# Patient Record
Sex: Male | Born: 1965 | Race: White | Hispanic: No | Smoking: Former smoker
Health system: Southern US, Community
[De-identification: ages and names within clinical notes are randomized; demographics above are authoritative.]

## PROBLEM LIST (undated history)

## (undated) DIAGNOSIS — T7840XA Allergy, unspecified, initial encounter: Secondary | ICD-10-CM

## (undated) DIAGNOSIS — B019 Varicella without complication: Secondary | ICD-10-CM

## (undated) DIAGNOSIS — G4730 Sleep apnea, unspecified: Secondary | ICD-10-CM

## (undated) DIAGNOSIS — E119 Type 2 diabetes mellitus without complications: Secondary | ICD-10-CM

## (undated) DIAGNOSIS — F1910 Other psychoactive substance abuse, uncomplicated: Secondary | ICD-10-CM

## (undated) DIAGNOSIS — N189 Chronic kidney disease, unspecified: Secondary | ICD-10-CM

## (undated) DIAGNOSIS — R51 Headache: Secondary | ICD-10-CM

## (undated) DIAGNOSIS — I10 Essential (primary) hypertension: Secondary | ICD-10-CM

## (undated) HISTORY — PX: MOUTH SURGERY: SHX715

## (undated) HISTORY — PX: COLONOSCOPY: SHX174

## (undated) HISTORY — DX: Other psychoactive substance abuse, uncomplicated: F19.10

## (undated) HISTORY — DX: Essential (primary) hypertension: I10

## (undated) HISTORY — DX: Varicella without complication: B01.9

## (undated) HISTORY — DX: Allergy, unspecified, initial encounter: T78.40XA

## (undated) HISTORY — DX: Type 2 diabetes mellitus without complications: E11.9

---

## 2002-01-21 ENCOUNTER — Ambulatory Visit (HOSPITAL_BASED_OUTPATIENT_CLINIC_OR_DEPARTMENT_OTHER): Admission: RE | Admit: 2002-01-21 | Discharge: 2002-01-21 | Payer: Self-pay | Admitting: Internal Medicine

## 2002-01-28 ENCOUNTER — Emergency Department (HOSPITAL_COMMUNITY): Admission: EM | Admit: 2002-01-28 | Discharge: 2002-01-28 | Payer: Self-pay | Admitting: Emergency Medicine

## 2002-07-15 ENCOUNTER — Emergency Department (HOSPITAL_COMMUNITY): Admission: EM | Admit: 2002-07-15 | Discharge: 2002-07-15 | Payer: Self-pay

## 2003-08-20 HISTORY — PX: UVULOPALATOPHARYNGOPLASTY: SHX827

## 2004-08-19 HISTORY — PX: TONSILLECTOMY: SHX5217

## 2004-10-12 ENCOUNTER — Inpatient Hospital Stay (HOSPITAL_COMMUNITY): Admission: RE | Admit: 2004-10-12 | Discharge: 2004-10-14 | Payer: Self-pay | Admitting: Otolaryngology

## 2011-08-22 ENCOUNTER — Ambulatory Visit (INDEPENDENT_AMBULATORY_CARE_PROVIDER_SITE_OTHER): Payer: BC Managed Care – PPO | Admitting: Family Medicine

## 2011-08-22 ENCOUNTER — Encounter: Payer: Self-pay | Admitting: Family Medicine

## 2011-08-22 DIAGNOSIS — R310 Gross hematuria: Secondary | ICD-10-CM

## 2011-08-22 DIAGNOSIS — R5383 Other fatigue: Secondary | ICD-10-CM

## 2011-08-22 DIAGNOSIS — R5381 Other malaise: Secondary | ICD-10-CM

## 2011-08-22 DIAGNOSIS — N529 Male erectile dysfunction, unspecified: Secondary | ICD-10-CM

## 2011-08-22 DIAGNOSIS — F17200 Nicotine dependence, unspecified, uncomplicated: Secondary | ICD-10-CM

## 2011-08-22 DIAGNOSIS — G4733 Obstructive sleep apnea (adult) (pediatric): Secondary | ICD-10-CM | POA: Insufficient documentation

## 2011-08-22 DIAGNOSIS — R1032 Left lower quadrant pain: Secondary | ICD-10-CM

## 2011-08-22 LAB — POCT URINALYSIS DIPSTICK
Blood, UA: NEGATIVE
Leukocytes, UA: NEGATIVE
Nitrite, UA: NEGATIVE
Protein, UA: NEGATIVE
Urobilinogen, UA: 1
pH, UA: 7

## 2011-08-22 LAB — CBC WITH DIFFERENTIAL/PLATELET
Basophils Absolute: 0.1 10*3/uL (ref 0.0–0.1)
Eosinophils Absolute: 0.5 10*3/uL (ref 0.0–0.7)
Lymphocytes Relative: 30.9 % (ref 12.0–46.0)
MCHC: 34 g/dL (ref 30.0–36.0)
Neutro Abs: 5.4 10*3/uL (ref 1.4–7.7)
Neutrophils Relative %: 57.5 % (ref 43.0–77.0)
Platelets: 387 10*3/uL (ref 150.0–400.0)
RDW: 12.6 % (ref 11.5–14.6)

## 2011-08-22 LAB — TSH: TSH: 0.48 u[IU]/mL (ref 0.35–5.50)

## 2011-08-22 LAB — BASIC METABOLIC PANEL
Chloride: 106 mEq/L (ref 96–112)
Creatinine, Ser: 0.9 mg/dL (ref 0.4–1.5)
Sodium: 140 mEq/L (ref 135–145)

## 2011-08-22 MED ORDER — VARENICLINE TARTRATE 0.5 MG X 11 & 1 MG X 42 PO MISC: ORAL | Status: AC

## 2011-08-22 MED ORDER — VARENICLINE TARTRATE 1 MG PO TABS: 1.0000 mg | ORAL_TABLET | Freq: Two times a day (BID) | ORAL | Status: AC

## 2011-08-22 MED ORDER — SILDENAFIL CITRATE 100 MG PO TABS: 50.0000 mg | ORAL_TABLET | Freq: Every day | ORAL | Status: DC | PRN

## 2011-08-22 NOTE — Progress Notes (Signed)
  Subjective:    Patient ID: Gary Mora, male    DOB: 1966/05/11, 46 y.o.   MRN: [id number]  HPI  New patient to establish care. Patient has history of obesity, obstructive sleep apnea and ongoing nicotine use. Past history of alcohol abuse but not for 2 years now. He relates recent problems with constipation past few weeks. This is a new symptom. Intermittent left lower quadrant pain with no clear triggering factors. Symptoms usually very brief. Pain is mild.  He also relates about 6 months of possible gross hematuria. Describes brownish discolored urine. Symptoms are very intermittent. No dysuria. No flank pain. Denies appetite or weight changes. No fever or chills.  No RUQ abdominal pain. No slow urine stream.  Ongoing nicotine use. Specifically requests trial of Chantix. Has tried nicotine replacement in past without success.  Past Medical History  Diagnosis Date  . Substance abuse   . Allergy   . Chicken pox    Past Surgical History  Procedure Date  . Tonsillectomy 2006  . Uvulopalatopharyngoplasty 2005    reports that he has been smoking Cigarettes.  He has a 30 pack-year smoking history. He does not have any smokeless tobacco history on file. His alcohol and drug histories not on file. family history includes Arthritis in his father and mother and Hypothyroidism in his sister. Allergies  Allergen Reactions  . Asa Buff (Mag (Buffered Aspirin)     hives      Review of Systems  Constitutional: Positive for fatigue. Negative for fever, chills, appetite change and unexpected weight change.  HENT: Negative for trouble swallowing.   Respiratory: Negative for cough and shortness of breath.   Cardiovascular: Negative for chest pain, palpitations and leg swelling.  Gastrointestinal: Positive for abdominal pain and constipation. Negative for nausea, vomiting and blood in stool.  Genitourinary: Positive for dysuria and hematuria. Negative for urgency, decreased urine volume and  discharge.  Skin: Negative for rash.  Neurological: Negative for dizziness, syncope and headaches.  Hematological: Negative for adenopathy.       Objective:   Physical Exam  Constitutional: He is oriented to person, place, and time. He appears well-developed and well-nourished.  HENT:  Right Ear: External ear normal.  Left Ear: External ear normal.  Mouth/Throat: Oropharynx is clear and moist.  Neck: Neck supple. No thyromegaly present.  Cardiovascular: Normal rate and regular rhythm.   Pulmonary/Chest: Effort normal and breath sounds normal. No respiratory distress. He has no wheezes. He has no rales.  Abdominal: Soft. Bowel sounds are normal. He exhibits no distension and no mass. There is no tenderness. There is no rebound and no guarding.  Musculoskeletal: He exhibits no edema.  Lymphadenopathy:    He has no cervical adenopathy.  Neurological: He is alert and oriented to person, place, and time.  Skin: No rash noted.  Psychiatric: He has a normal mood and affect.          Assessment & Plan:  #1 nicotine use. Discussed smoking cessation strategies. Trial of Chantix. Starter pack and maintenance pack written for  #2 intermittent left lower quadrant abdominal pain. Symptoms mild. No history of known diverticular disease. Check CBC and urine dipstick. #3 reported gross hematuria. He describes "brown" urine.  Recheck urine dipstick. Consider urology referral if hematuria confirmed. #4 constipation with family history of hypothyroidism. Check TSH. #5 erectile dysfunction. Trial of Viagra 50-100 mg daily as needed

## 2011-08-23 NOTE — Progress Notes (Signed)
Quick Note:  Wife informed, pt will pick up hemocult card ______

## 2011-09-19 ENCOUNTER — Ambulatory Visit
Admission: RE | Admit: 2011-09-19 | Discharge: 2011-09-19 | Disposition: A | Discharge status: Home / Self Care | Payer: BC Managed Care – PPO | Source: Ambulatory Visit | Attending: Family Medicine | Admitting: Family Medicine

## 2011-09-19 DIAGNOSIS — R1032 Left lower quadrant pain: Secondary | ICD-10-CM

## 2011-09-19 DIAGNOSIS — R310 Gross hematuria: Secondary | ICD-10-CM

## 2011-09-20 NOTE — Progress Notes (Signed)
Quick Note:  Pt father informed ______ 

## 2011-11-20 ENCOUNTER — Ambulatory Visit: Payer: Self-pay | Admitting: Family Medicine

## 2011-11-20 ENCOUNTER — Telehealth: Payer: Self-pay | Admitting: *Deleted

## 2011-11-20 DIAGNOSIS — Z0289 Encounter for other administrative examinations: Secondary | ICD-10-CM

## 2011-11-20 NOTE — Telephone Encounter (Signed)
Message left on home VM that he was a No Show for 3 month F/U today, requested a call back to explain and reschedule.

## 2012-08-20 ENCOUNTER — Other Ambulatory Visit (INDEPENDENT_AMBULATORY_CARE_PROVIDER_SITE_OTHER): Payer: BC Managed Care – PPO

## 2012-08-20 DIAGNOSIS — Z0000 Encounter for general adult medical examination without abnormal findings: Secondary | ICD-10-CM

## 2012-08-20 LAB — BASIC METABOLIC PANEL
CO2: 30 mEq/L (ref 19–32)
Calcium: 8.4 mg/dL (ref 8.4–10.5)
Creatinine, Ser: 0.8 mg/dL (ref 0.4–1.5)
GFR: 108.57 mL/min (ref >60.00)

## 2012-08-20 LAB — CBC WITH DIFFERENTIAL/PLATELET
Basophils Absolute: 0.1 10*3/uL (ref 0.0–0.1)
Basophils Relative: 0.5 % (ref 0.0–3.0)
Eosinophils Absolute: 0.5 10*3/uL (ref 0.0–0.7)
Lymphocytes Relative: 25.4 % (ref 12.0–46.0)
MCHC: 33.9 g/dL (ref 30.0–36.0)
Monocytes Relative: 4.6 % (ref 3.0–12.0)
Neutrophils Relative %: 65.2 % (ref 43.0–77.0)
RBC: 5.3 Mil/uL (ref 4.22–5.81)

## 2012-08-20 LAB — LIPID PANEL
Cholesterol: 158 mg/dL (ref 0–200)
HDL: 20.2 mg/dL — ABNORMAL LOW (ref >39.00)
Total CHOL/HDL Ratio: 8
Triglycerides: 336 mg/dL — ABNORMAL HIGH (ref 0.0–149.0)
VLDL: 67.2 mg/dL — ABNORMAL HIGH (ref 0.0–40.0)

## 2012-08-20 LAB — POCT URINALYSIS DIPSTICK
Glucose, UA: NEGATIVE
Ketones, UA: NEGATIVE
Spec Grav, UA: 1.025

## 2012-08-20 LAB — HEPATIC FUNCTION PANEL
Alkaline Phosphatase: 42 U/L (ref 39–117)
Bilirubin, Direct: 0 mg/dL (ref 0.0–0.3)
Total Protein: 7 g/dL (ref 6.0–8.3)

## 2012-08-24 ENCOUNTER — Encounter: Payer: Self-pay | Admitting: Family Medicine

## 2012-08-24 ENCOUNTER — Ambulatory Visit (INDEPENDENT_AMBULATORY_CARE_PROVIDER_SITE_OTHER): Payer: BC Managed Care – PPO | Admitting: Family Medicine

## 2012-08-24 VITALS — BP 136/84 | HR 72 | Temp 98.5°F | Resp 12 | Ht 71.0 in | Wt 262.0 lb

## 2012-08-24 DIAGNOSIS — R319 Hematuria, unspecified: Secondary | ICD-10-CM

## 2012-08-24 DIAGNOSIS — Z0000 Encounter for general adult medical examination without abnormal findings: Secondary | ICD-10-CM

## 2012-08-24 DIAGNOSIS — E8881 Metabolic syndrome: Secondary | ICD-10-CM

## 2012-08-24 LAB — POCT URINALYSIS DIPSTICK
Protein, UA: NEGATIVE
Spec Grav, UA: 1.015
Urobilinogen, UA: 0.2

## 2012-08-24 MED ORDER — SILDENAFIL CITRATE 100 MG PO TABS: 100.0000 mg | ORAL_TABLET | ORAL | Status: DC | PRN

## 2012-08-24 MED ORDER — TETANUS-DIPHTH-ACELL PERTUSSIS 5-2.5-18.5 LF-MCG/0.5 IM SUSP: 0.5000 mL | Freq: Once | INTRAMUSCULAR | Status: DC

## 2012-08-24 NOTE — Progress Notes (Signed)
Subjective:    Patient ID: Gary Mora, male    DOB: 1966-05-11, 47 y.o.   MRN: [id number]  HPI Patient here for complete physical. He has history of obesity and obstructive sleep apnea. He has done well following uvulopalatophryngioplasty and no longer uses CPAP. He does still smoke and is trying to quit on his own. He is also recently started back exercising. Last tetanus was 10 years ago. Already had flu vaccine. Drinks lots of sodas. Excessive calorie intake.  History of recurrent boils right thigh. Went to urgent care twice. Initially placed on sulfa and improved. Subsequently had recurrence several months ago and went back for incision and drainage and placed on clindamycin. To his knowledge, never cultured. Then had recurrent boil few days ago. This ruptured yesterday with drainage of pus. Much improved today. No fevers. No chills.  Patient had reported hematuria several months ago. Was seen here in urine dipstick negative. No recent gross hematuria.  Past Medical History  Diagnosis Date  . Allergy   . Chicken pox   . Substance abuse    Past Surgical History  Procedure Date  . Tonsillectomy 2006  . Uvulopalatopharyngoplasty 2005    reports that he has been smoking Cigarettes.  He has a 30 pack-year smoking history. He does not have any smokeless tobacco history on file. His alcohol and drug histories not on file. family history includes Arthritis in his father and mother and Hypothyroidism in his sister. Allergies  Allergen Reactions  . Asa Buff (Mag (Buffered Aspirin)     hives      Review of Systems  Constitutional: Negative for fever, activity change, appetite change and fatigue.  HENT: Negative for ear pain, congestion and trouble swallowing.   Eyes: Negative for pain and visual disturbance.  Respiratory: Negative for cough, shortness of breath and wheezing.   Cardiovascular: Negative for chest pain and palpitations.  Gastrointestinal: Negative for nausea, vomiting,  abdominal pain, diarrhea, constipation, blood in stool, abdominal distention and rectal pain.  Genitourinary: Negative for dysuria, hematuria and testicular pain.  Musculoskeletal: Negative for joint swelling and arthralgias.  Skin: Negative for rash.  Neurological: Negative for dizziness, syncope and headaches.  Hematological: Negative for adenopathy.  Psychiatric/Behavioral: Negative for confusion and dysphoric mood.       Objective:   Physical Exam  Constitutional: He is oriented to person, place, and time. He appears well-developed and well-nourished. No distress.  HENT:  Head: Normocephalic and atraumatic.  Right Ear: External ear normal.  Left Ear: External ear normal.  Mouth/Throat: Oropharynx is clear and moist.  Eyes: Conjunctivae normal and EOM are normal. Pupils are equal, round, and reactive to light.  Neck: Normal range of motion. Neck supple. No thyromegaly present.  Cardiovascular: Normal rate, regular rhythm and normal heart sounds.   No murmur heard. Pulmonary/Chest: No respiratory distress. He has no wheezes. He has no rales.  Abdominal: Soft. Bowel sounds are normal. He exhibits no distension and no mass. There is no tenderness. There is no rebound and no guarding.  Musculoskeletal: He exhibits no edema.  Lymphadenopathy:    He has no cervical adenopathy.  Neurological: He is alert and oriented to person, place, and time. He displays normal reflexes. No cranial nerve deficit.  Skin: No rash noted.       Right upper inner groin reveals very small wound about 2 mm diameter with no fluctuance. No pustules. Nontender. No cellulitis changes.  Psychiatric: He has a normal mood and affect.  Assessment & Plan:  #1 health maintenance. Patient has metabolic syndrome based on high triglycerides, low HDL, waist circumference 42 inches, and borderline elevated blood pressure. Thus far no hyperglycemia. Work on weight loss. Reduce soda intake. Needs to quit smoking.   #2 recurrent goals right thigh. Apparently had abscess which drained spontaneously yesterday and is much improved today. For any future occurrences get in here for incision and drainage and culture #3 trace blood on urine dipstick. Repeat urinalysis and if blood persists urine micro-

## 2012-08-24 NOTE — Patient Instructions (Addendum)
Metabolic Syndrome, Adult Metabolic syndrome descibes a group of risk factors for heart disease and diabetes. This syndrome has other names including Insulin Resistance Syndrome. The more risk factors you have, the higher your risk of having a heart attack, stroke, or developing diabetes. These risk factors include:  High blood sugar.  High blood triglyceride (a fat found in the blood) level.  High blood pressure.  Abdominal obesity (your extra weight is around your waist instead of your hips).  Low levels of high-density lipoprotein, HDL (good blood cholesterol). If you have any three of these risk factors, you have metabolic syndrome. If you have even one of these factors, you should make lifestyle changes to improve your health in order to prevent serious health diseases.  In people with metabolic syndrome, the cells do not respond properly to insulin. This can lead to high levels of glucose in the blood, which can interfere with normal body processes. Eventually, this can cause high blood pressure and higher fat levels in the blood, and inflammation of your blood vessels. The result can be heart disease and stroke.  CAUSES   Eating a diet rich in calories and saturated fat.  Too little physical activity.  Being overweight. Other underlying causes are:  Family history (genetics).  Ethnicity (South Asians are at a higher risk).  Older age (your chances of developing metabolic syndrome are higher as you grow older).  Insulin resistance. SYMPTOMS  By itself, metabolic syndrome has no symptoms. However, you might have symptoms of diabetes (high blood sugar) or high blood pressure, such as:  Increased thirst, urination, and tiredness.  Dizzy spells.  Dull headaches that are unusual for you.  Blurred vision.  Nosebleeds. DIAGNOSIS  Your caregiver may make a diagnosis of metabolic syndrome if you have at least three of these factors:  If you are overweight mostly around the  waist. This means a waistline greater than 40" in men and more than 35" in women. The waistline limits are 31 to 35 inches for women and 37 to 39 inches for men. In those who have certain genetic risk factors, such as having a family history of diabetes or being of Asian descent.  If you have a blood pressure of 130/85 mm Hg or more, or if you are being treated for high blood pressure.  If your blood triglyceride level is 150 mg/dL or more, or you are being treated for high levels of triglyceride.  If the level of HDL in your blood is below 40 mg/dL in men, less than 50 mg/dL in women, or you are receiving treatment for low levels of HDL.  If the level of sugar in your blood is high with fasting blood sugar level of 110 mg/dL or more, or you are under treatment for diabetes. TREATMENT  Your caregiver may have you make lifestyle changes, which may include:  Exercise.  Losing weight.  Maintaining a healthy diet.  Quitting smoking. The lifestyle changes listed above are key in reducing your risk for heart disease and stroke. Medicines may also be prescribed to help your body respond to insulin better and to reduce your blood pressure and blood fat levels. Aspirin may be recommended to reduce risks of heart disease or stroke.  HOME CARE INSTRUCTIONS   Exercise.  Measure your waist at regular intervals just above the hipbones after you have breathed out.  Maintain a healthy diet.  Eat fruits, such as apples, oranges, and pears.  Eat vegetables.  Eat legumes, such as kidney   beans, peas, and lentils.  Eat food rich in soluble fiber, such as whole grain cereal, oatmeal, and oat bran.  Use olive or safflower oils and avoid saturated fats.  Eat nuts.  Limit the amount of salt you eat or add to food.  Limit the amount of alcohol you drink.  Include fish in your diet, if possible.  Stop smoking if you are a smoker.  Maintain regular follow-up appointments.  Follow your  caregiver's advice. SEEK MEDICAL CARE IF:   You feel very tired or fatigued.  You develop excessive thirst.  You pass large quantities of urine.  You are putting on weight around your waist rather than losing weight.  You develop headaches over and over again.  You have off-and-on dizzy spells. SEEK IMMEDIATE MEDICAL CARE IF:   You develop nosebleeds.  You develop sudden blurred vision.  You develop sudden dizzy spells.  You develop chest pains, trouble breathing, or feel an abnormal or irregular heart beat.  You have a fainting episode.  You develop any sudden trouble speaking and/or swallowing.  You develop sudden weakness in one arm and/or one leg. MAKE SURE YOU:   Understand these instructions.  Will watch your condition.  Will get help right away if you are not doing well or get worse. Document Released: 11/12/2007 Document Revised: 10/28/2011 Document Reviewed: 11/12/2007 [facility] Patient Information 2013 [place], [place].

## 2012-08-28 ENCOUNTER — Other Ambulatory Visit: Payer: Self-pay | Admitting: Family Medicine

## 2012-09-04 ENCOUNTER — Encounter: Payer: Self-pay | Admitting: Family Medicine

## 2012-11-05 ENCOUNTER — Other Ambulatory Visit: Payer: Self-pay | Admitting: Urology

## 2012-11-30 ENCOUNTER — Encounter (HOSPITAL_COMMUNITY): Payer: Self-pay | Admitting: *Deleted

## 2012-11-30 NOTE — Pre-Procedure Instructions (Signed)
Asked to bring blue folder the day of the procedure,insurance card,I.D. driver's license,wear comfortable clothing and have a driver for the day. Asked not to take Advil,Motrin,Ibuprofen,Aleve or any NSAIDS, Aspirin, Aspirin products or Toradol for 72 hours prior to procedure,  No vitamins or herbal medications 7 days prior to procedure. Instructed to take laxative per doctor's office instructions and eat a light dinner the evening before procedure.   To arrive at 0530  for lithotripsy procedure.

## 2012-12-04 ENCOUNTER — Encounter (HOSPITAL_COMMUNITY): Payer: Self-pay | Admitting: Pharmacy Technician

## 2012-12-18 NOTE — H&P (Signed)
Reason For Visit  Gary Mora returns for followup today. He is here to discuss his recent CT findings. His history is summarized below. At his last visit the patient had a urine test for NMP 22 which was negative. Patient also has subsequently had a hematuria protocol CT which showed a relatively large 13 mm stone in the lower pole of his left kidney. This was not causing significant obstruction.   History of Present Illness         Gary Mora presented recently as a consultation request of Dr. Caryl Never for further assessment of some nonspecific left-sided back/flank discomfort and previous episode of gross hematuria. Gary Mora is currently 47 years of age. In the past we saw him for elective vasectomy and subsequently for some mild testicular discomfort. The patient had a renal ultrasound performed approximately a year ago after he complained of some nonspecific left flank pain and had multiple episodes of gross hematuria. More recently he has been noted to have persistent microhematuria. BUN and creatinine were recently normal at 11 and 0.8 respectively. The patient does have a tobacco use history of approximately 30 pack years. No pain or gross hematuria x 1 year. Positive family h/o kidney stones. Has been using 50-100mg  prn and this works well. No voiding complaints. No history of testosterone or PSA testing.   Past Medical History Problems  1. History of  Adult Sleep Apnea 780.57 2. Former Smoker V15.82  Surgical History Problems  1. History of  Oral Surgery Tooth Extraction 2. History of  Throat Surgery  Current Meds 1. Aleve CAPS; Therapy: (Recorded:30Jan2014) to 2. Chantix TABS; Therapy: (Recorded:30Jan2014) to 3. PredniSONE 50 MG Oral Tablet; (1) TAB PO STARTING 13H PRIOR THEN 7H PRIOR AND AGAIN  AT 1H PRIOR TO CT; Therapy: 03Feb2014 to (Last Rx:03Feb2014)  Requested for: 03Feb2014 4. Viagra 100 MG Oral Tablet; Therapy: 06Jan2014 to  Allergies Medication  1. Aspirin TABS Non-Medication   2. Shellfish  Family History Problems  1. Family history of  Family Health Status - Father's Age 403. Family history of  Family Health Status - Mother's Age 40. Family history of  Family Health Status Number Of Children 1 son, 1 daughter 4. Maternal history of  Nephrolithiasis  Social History Problems  1. Caffeine Use 3/day 2. Marital History - Divorced V61.03 Denied  3. History of  Alcohol Use  Review of Systems Genitourinary, constitutional, skin, eye, otolaryngeal, hematologic/lymphatic, cardiovascular, pulmonary, endocrine, musculoskeletal, gastrointestinal, neurological and psychiatric system(s) were reviewed and pertinent findings if present are noted.  Genitourinary: nocturia and erectile dysfunction, but no hematuria and preserved libido.  Gastrointestinal: flank pain and abdominal pain.    Vitals Vital Signs [Data Includes: Last 1 Day]  13Mar2014 09:56AM  Blood Pressure: 165 / 104 Temperature: 97.9 F Heart Rate: 109  Well-developed well-nourished male in no acute distress Respiratory: Normal effort Cardiac: Regular rate and rhythm Abdomen: Soft and nontender no palpable masses no CVA tenderness Extremities: No tenderness no edema Neurologic: Nonfocal grossly  Results/Data Urine [Data Includes: Last 1 Day]   13Mar2014 COLOR YELLOW  APPEARANCE CLEAR  SPECIFIC GRAVITY 1.015  pH 6.5  GLUCOSE NEG mg/dL BILIRUBIN NEG  KETONE NEG mg/dL BLOOD SMALL  PROTEIN NEG mg/dL UROBILINOGEN 1 mg/dL NITRITE NEG  LEUKOCYTE ESTERASE NEG  SQUAMOUS EPITHELIAL/HPF NONE SEEN  WBC NONE SEEN WBC/hpf RBC 0-2 RBC/hpf BACTERIA NONE SEEN  CRYSTALS NONE SEEN  CASTS NONE SEEN  Other AMORPHOUS PRESENT  Selected Results  UA With REFLEX 13Mar2014 09:45AM Barron Al[facility]  Test Name Result  Flag Reference COLOR YELLOW  YELLOW APPEARANCE CLEAR  CLEAR SPECIFIC GRAVITY 1.015  1.005-1.030 pH 6.5  5.0-8.0 GLUCOSE NEG mg/dL  NEG BILIRUBIN NEG  NEG KETONE NEG  mg/dL  NEG BLOOD SMALL A NEG PROTEIN NEG mg/dL  NEG UROBILINOGEN 1 mg/dL  2.9-5.2 NITRITE NEG  NEG LEUKOCYTE ESTERASE NEG  NEG SQUAMOUS EPITHELIAL/HPF NONE SEEN  RARE WBC NONE SEEN WBC/hpf  <3 RBC 0-2 RBC/hpf  <3 BACTERIA NONE SEEN  RARE CRYSTALS NONE SEEN  NONE SEEN CASTS NONE SEEN  NONE SEEN Other AMORPHOUS PRESENT    AU CT-HEMATURIA PROTOCOL 10Mar2014 12:00AM Barron Al[facility]  Test Name Result Flag Reference ** RADIOLOGY REPORT BY [facility] **   *RADIOLOGY REPORT*  Clinical Data: Gross hematuria.  CT ABDOMEN AND PELVIS WITHOUT AND WITH CONTRAST  Technique: Multidetector CT imaging of the abdomen and pelvis was performed without contrast material in one or both body regions, followed by contrast material(s) and further sections in one or both body regions.  Contrast: 125 ml of Isovue 300.  Comparison: No priors.  Findings:  Lung Bases: Unremarkable.  Abdomen/Pelvis: 13 mm calculus in the lower pole collecting system of the left kidney. No additional calcifications are noted within the contralateral collecting system, along the course of either ureter, or within the lumen of the urinary bladder. No hydroureteronephrosis or perinephric stranding to suggest urinary tract obstruction at this time. Post contrast images of the right kidney are unremarkable. Multiple low attenuation lesions in the left kidney are noted, the largest of which are compatible with simple cysts, with the largest lesion measuring 3.2 cm in the lower pole of the left kidney. Several of the other lesions are too small to definitively characterize, but are also favored to represent small cysts. Post contrast delayed images demonstrate no definite filling defect within the collecting system of either kidney, along the course of either ureter, or within the lumen of the urinary bladder to strongly suggest the presence of a urothelial neoplasm at this time.  The appearance of the  liver, gallbladder, pancreas, spleen and bilateral adrenal glands is unremarkable. There is a retroaortic left renal vein (normal anatomical variant) incidentally noted. Atherosclerosis throughout the abdominal and pelvic vasculature, without definite aneurysm or dissection. There are a few colonic diverticula, most pronounced in the region of the sigmoid colon, without surrounding inflammatory changes to suggest acute colonic diverticulitis at this time. No significant volume of ascites. No pneumoperitoneum. No pathologic distension of small bowel. Prostate gland is unremarkable in appearance.  Musculoskeletal: There are no aggressive appearing lytic or blastic lesions noted in the visualized portions of the skeleton.  IMPRESSION: 1. A 13 mm nonobstructive calculus in the lower pole collecting system of the left kidney. No ureteral stones or findings of urinary tract obstruction at this time. 2. Multiple low attenuation lesions in the left kidney. The largest of these are compatible with simple cysts, while the smaller lesions are too small to definitively characterize (but also likely represent small cysts). 3. Retroaortic left renal vein (normal anatomical variant) incidentally noted. 4. Atherosclerosis.   Original Report Authenticated By: Trudie Reed, M.D.  [id number] 30Jan2014 11:06AM [facility] SPECIMEN TYPE: URINE  Test Name Result Flag Reference NMP22 Negative  Negative  Assessment Assessed  1. Nephrolithiasis 592.0 2. 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 Gross Hematuria 599.71  Plan Health Maintenance (V70.0)  1. UA With REFLEX  Done: 13Mar2014 09:45AM  Discussion/Summary   Gary Mora has a 13 mm nonobstructing stone in the lower  pole of his left kidney. Presumptively, this is why he has had the hematuria. I do think that there is indication to treat the stone. His stone is quite faint on scout imaging. Hounsfield units are just over 400. This  is likely to be a fairly soft stone that should be amenable to successful ESWL. I think given the Hounsfield units and the size, that that would be a reasonable approach. Obviously, there may be some difficulty getting all of the pieces out of the lower pole, but we will certainly encourage him to do some things to try to improve clearance of the fragments. We discussed the procedure. I think we can forego cystoscopy for the time being in light of the diagnosis of the stone and the negative urine cytology. We will set this procedure up for sometime in the next several weeks on a nonurgent basis.

## 2012-12-21 ENCOUNTER — Encounter (HOSPITAL_COMMUNITY)
Admission: RE | Disposition: A | Discharge status: Home / Self Care | Payer: Self-pay | Source: Ambulatory Visit | Attending: Urology

## 2012-12-21 ENCOUNTER — Ambulatory Visit (HOSPITAL_COMMUNITY): Payer: BC Managed Care – PPO

## 2012-12-21 ENCOUNTER — Encounter (HOSPITAL_COMMUNITY): Payer: Self-pay | Admitting: General Practice

## 2012-12-21 ENCOUNTER — Ambulatory Visit (HOSPITAL_COMMUNITY)
Admission: RE | Admit: 2012-12-21 | Discharge: 2012-12-21 | Disposition: A | Discharge status: Home / Self Care | Payer: BC Managed Care – PPO | Source: Ambulatory Visit | Attending: Urology | Admitting: Urology

## 2012-12-21 DIAGNOSIS — Z79899 Other long term (current) drug therapy: Secondary | ICD-10-CM | POA: Insufficient documentation

## 2012-12-21 DIAGNOSIS — G4730 Sleep apnea, unspecified: Secondary | ICD-10-CM | POA: Insufficient documentation

## 2012-12-21 DIAGNOSIS — N200 Calculus of kidney: Secondary | ICD-10-CM

## 2012-12-21 HISTORY — DX: Headache: R51

## 2012-12-21 HISTORY — DX: Sleep apnea, unspecified: G47.30

## 2012-12-21 HISTORY — DX: Chronic kidney disease, unspecified: N18.9

## 2012-12-21 SURGERY — LITHOTRIPSY, ESWL
Anesthesia: LOCAL | Laterality: Left

## 2012-12-21 MED ORDER — HYDROCODONE-ACETAMINOPHEN 5-325 MG PO TABS: 1.0000 | ORAL_TABLET | Freq: Four times a day (QID) | ORAL | Status: DC | PRN

## 2012-12-21 MED ORDER — DIAZEPAM 5 MG PO TABS
10.0000 mg | ORAL_TABLET | ORAL | Status: AC
  Administered 2012-12-21: 10 mg via ORAL
  Filled 2012-12-21: qty 2

## 2012-12-21 MED ORDER — DEXTROSE-NACL 5-0.45 % IV SOLN
INTRAVENOUS | Status: DC
  Administered 2012-12-21: 07:00:00 via INTRAVENOUS

## 2012-12-21 MED ORDER — CIPROFLOXACIN HCL 500 MG PO TABS
500.0000 mg | ORAL_TABLET | ORAL | Status: AC
  Administered 2012-12-21: 500 mg via ORAL
  Filled 2012-12-21: qty 1

## 2012-12-21 MED ORDER — DIPHENHYDRAMINE HCL 25 MG PO CAPS
25.0000 mg | ORAL_CAPSULE | ORAL | Status: AC
  Administered 2012-12-21: 25 mg via ORAL
  Filled 2012-12-21: qty 1

## 2012-12-21 NOTE — Op Note (Signed)
See [facility] OP note scanned into chart. 

## 2012-12-21 NOTE — Interval H&P Note (Signed)
History and Physical Interval Note:  12/21/2012 8:06 AM  Gary Mora  has presented today for surgery, with the diagnosis of Renal Calculus  The various methods of treatment have been discussed with the patient and family. After consideration of risks, benefits and other options for treatment, the patient has consented to  Procedure(s): EXTRACORPOREAL SHOCK WAVE LITHOTRIPSY (ESWL) (Left) as a surgical intervention .  The patient's history has been reviewed, patient examined, no change in status, stable for surgery.  I have reviewed the patient's chart and labs.  Questions were answered to the patient's satisfaction.     Richy Spradley S

## 2014-02-01 ENCOUNTER — Ambulatory Visit (INDEPENDENT_AMBULATORY_CARE_PROVIDER_SITE_OTHER): Payer: BC Managed Care – PPO | Admitting: Family Medicine

## 2014-02-01 ENCOUNTER — Encounter: Payer: Self-pay | Admitting: Family Medicine

## 2014-02-01 VITALS — BP 130/82 | HR 120 | Temp 98.6°F | Ht 71.0 in | Wt 279.0 lb

## 2014-02-01 DIAGNOSIS — R059 Cough, unspecified: Secondary | ICD-10-CM

## 2014-02-01 DIAGNOSIS — J329 Chronic sinusitis, unspecified: Secondary | ICD-10-CM

## 2014-02-01 DIAGNOSIS — R05 Cough: Secondary | ICD-10-CM

## 2014-02-01 DIAGNOSIS — J310 Chronic rhinitis: Secondary | ICD-10-CM

## 2014-02-01 DIAGNOSIS — J309 Allergic rhinitis, unspecified: Secondary | ICD-10-CM

## 2014-02-01 MED ORDER — HYDROCOD POLST-CHLORPHEN POLST 10-8 MG/5ML PO LQCR: ORAL | Status: DC

## 2014-02-01 NOTE — Patient Instructions (Signed)
-  claritin and nasocort daily for 1 month  -Afrin for 4 days per instructions then stop  -As we discussed, we have prescribed a new medication (cough syrup) for you at this appointment. We discussed the common and serious potential adverse effects of this medication and you can review these and more with the pharmacist when you pick up your medication.  Please follow the instructions for use carefully and notify us immediately if you have any problems taking this medication.  -follow up if trouble breathing, fevers, facial pain, worsening instead of improving in 5-7 days or other concerns

## 2014-02-01 NOTE — Progress Notes (Signed)
No chief complaint on file.   HPI:  Acute visit for:  Cough: -started: yesterday -symptoms:nasal congestion, sore throat, cough -denies:fever, SOB, NVD, tooth pain -has tried: claritin -sick contacts/travel/risks: denies flu exposure, tick exposure or or Ebola risks -Hx of: allergies, pneumonia  ROS: See pertinent positives and negatives per HPI.  Past Medical History  Diagnosis Date  . Allergy   . Chicken pox   . Substance abuse   . Chronic kidney disease     kidney stone  . Headache(784.0)     tension after using the computer  . Sleep apnea     Had surgery to correct no cpapa used    Past Surgical History  Procedure Laterality Date  . Tonsillectomy  2006  . Uvulopalatopharyngoplasty  2005    Family History  Problem Relation Age of Onset  . Arthritis Mother   . Arthritis Father   . Hypothyroidism Sister     History   Social History  . Marital Status: Divorced    Spouse Name: N/A    Number of Children: N/A  . Years of Education: N/A   Social History Main Topics  . Smoking status: Former Smoker -- 1.00 packs/day for 30 years    Types: Cigarettes  . Smokeless tobacco: Never Used  . Alcohol Use: No  . Drug Use: No  . Sexual Activity: None   Other Topics Concern  . None   Social History Narrative  . None    Current outpatient prescriptions:Ascorbic Acid (VITAMIN C) 100 MG tablet, Take 100 mg by mouth daily.  , Disp: , Rfl: ;  loratadine (CLARITIN) 10 MG tablet, Take 10 mg by mouth daily., Disp: , Rfl: ;  multivitamin-iron-minerals-folic acid (CENTRUM) chewable tablet, Chew 1 tablet by mouth daily.  , Disp: , Rfl: ;  naproxen sodium (ANAPROX) 220 MG tablet, Take 220 mg by mouth 2 (two) times daily with a meal., Disp: , Rfl:  sildenafil (VIAGRA) 100 MG tablet, Take 1 tablet (100 mg total) by mouth as needed for erectile dysfunction., Disp: 6 tablet, Rfl: 11;  chlorpheniramine-HYDROcodone (TUSSIONEX PENNKINETIC ER) 10-8 MG/5ML LQCR, 51mL prn qhs for cough -  do not drive or operate machinery, Disp: 115 mL, Rfl: 0  EXAM:  Filed Vitals:   02/01/14 1528  BP: 130/82  Pulse: 120  Temp: 98.6 F (37 C)    Body mass index is 38.93 kg/(m^2).  GENERAL: vitals reviewed and listed above, alert, oriented, appears well hydrated and in no acute distress  HEENT: atraumatic, conjunttiva clear, no obvious abnormalities on inspection of external nose and ears, normal appearance of ear canals and TMs, clear nasal congestion, mild post oropharyngeal erythema with PND, no tonsillar edema or exudate, no sinus TTP  NECK: no obvious masses on inspection  LUNGS: clear to auscultation bilaterally, no wheezes, rales or rhonchi, good air movement  CV: HRRR, no peripheral edema  MS: moves all extremities without noticeable abnormality  PSYCH: pleasant and cooperative, no obvious depression or anxiety  ASSESSMENT AND PLAN:  Discussed the following assessment and plan:  Rhinosinusitis  Allergic rhinitis  Cough - Plan: chlorpheniramine-HYDROcodone (TUSSIONEX PENNKINETIC ER) 10-8 MG/5ML LQCR  -given HPI and exam findings today, a serious infection or illness is unlikely. We discussed potential etiologies, with  Allergic or VURI being most likely, and advised supportive care and monitoring and treatment of allergies. We discussed treatment side effects, likely course, antibiotic misuse, transmission, and signs of developing a serious illness. -of course, we advised to return or notify a  doctor immediately if symptoms worsen or persist or new concerns arise.    Patient Instructions  -claritin and nasocort daily for 1 month  -Afrin for 4 days per instructions then stop  -As we discussed, we have prescribed a new medication (cough syrup) for you at this appointment. We discussed the common and serious potential adverse effects of this medication and you can review these and more with the pharmacist when you pick up your medication.  Please follow the  instructions for use carefully and notify us immediately if you have any problems taking this medication.  -follow up if trouble breathing, fevers, facial pain, worsening instead of improving in 5-7 days or other concerns      KIM, HANNAH R.

## 2014-02-01 NOTE — Progress Notes (Signed)
Pre visit review using our clinic review tool, if applicable. No additional management support is needed unless otherwise documented below in the visit note. 

## 2014-03-03 ENCOUNTER — Other Ambulatory Visit (INDEPENDENT_AMBULATORY_CARE_PROVIDER_SITE_OTHER): Payer: BC Managed Care – PPO

## 2014-03-03 DIAGNOSIS — Z0000 Encounter for general adult medical examination without abnormal findings: Secondary | ICD-10-CM

## 2014-03-03 LAB — CBC WITH DIFFERENTIAL/PLATELET
BASOS PCT: 0.5 % (ref 0.0–3.0)
Basophils Absolute: 0.1 10*3/uL (ref 0.0–0.1)
EOS PCT: 4.9 % (ref 0.0–5.0)
Eosinophils Absolute: 0.5 10*3/uL (ref 0.0–0.7)
HCT: 48.7 % (ref 39.0–52.0)
HEMOGLOBIN: 16.4 g/dL (ref 13.0–17.0)
LYMPHS PCT: 30.3 % (ref 12.0–46.0)
Lymphs Abs: 3.1 10*3/uL (ref 0.7–4.0)
MCHC: 33.7 g/dL (ref 30.0–36.0)
MCV: 91.3 fl (ref 78.0–100.0)
MONO ABS: 0.6 10*3/uL (ref 0.1–1.0)
Monocytes Relative: 5.9 % (ref 3.0–12.0)
NEUTROS ABS: 6 10*3/uL (ref 1.4–7.7)
NEUTROS PCT: 58.4 % (ref 43.0–77.0)
Platelets: 425 10*3/uL — ABNORMAL HIGH (ref 150.0–400.0)
RBC: 5.33 Mil/uL (ref 4.22–5.81)
RDW: 13.5 % (ref 11.5–15.5)
WBC: 10.2 10*3/uL (ref 4.0–10.5)

## 2014-03-03 LAB — BASIC METABOLIC PANEL
BUN: 15 mg/dL (ref 6–23)
CHLORIDE: 98 meq/L (ref 96–112)
CO2: 26 meq/L (ref 19–32)
CREATININE: 0.7 mg/dL (ref 0.4–1.5)
Calcium: 9.1 mg/dL (ref 8.4–10.5)
GFR: 131.99 mL/min (ref >60.00)
Glucose, Bld: 95 mg/dL (ref 70–99)
Potassium: 4 mEq/L (ref 3.5–5.1)
SODIUM: 135 meq/L (ref 135–145)

## 2014-03-03 LAB — HEPATIC FUNCTION PANEL
ALBUMIN: 3.9 g/dL (ref 3.5–5.2)
ALK PHOS: 50 U/L (ref 39–117)
ALT: 27 U/L (ref 0–53)
AST: 29 U/L (ref 0–37)
BILIRUBIN DIRECT: 0.1 mg/dL (ref 0.0–0.3)
TOTAL PROTEIN: 7.6 g/dL (ref 6.0–8.3)
Total Bilirubin: 0.7 mg/dL (ref 0.2–1.2)

## 2014-03-03 LAB — POCT URINALYSIS DIPSTICK
Bilirubin, UA: NEGATIVE
Blood, UA: NEGATIVE
Glucose, UA: NEGATIVE
KETONES UA: NEGATIVE
Leukocytes, UA: NEGATIVE
Nitrite, UA: NEGATIVE
PH UA: 5.5
PROTEIN UA: NEGATIVE
UROBILINOGEN UA: 0.2

## 2014-03-03 LAB — LIPID PANEL
CHOL/HDL RATIO: 8
Cholesterol: 169 mg/dL (ref 0–200)
HDL: 22.1 mg/dL — ABNORMAL LOW (ref >39.00)
LDL CALC: 82 mg/dL (ref 0–99)
NONHDL: 146.9
Triglycerides: 326 mg/dL — ABNORMAL HIGH (ref 0.0–149.0)
VLDL: 65.2 mg/dL — AB (ref 0.0–40.0)

## 2014-03-03 LAB — TSH: TSH: 0.74 u[IU]/mL (ref 0.35–4.50)

## 2014-03-11 ENCOUNTER — Ambulatory Visit (INDEPENDENT_AMBULATORY_CARE_PROVIDER_SITE_OTHER): Payer: BC Managed Care – PPO | Admitting: Family Medicine

## 2014-03-11 ENCOUNTER — Encounter: Payer: Self-pay | Admitting: Family Medicine

## 2014-03-11 VITALS — BP 140/80 | HR 106 | Temp 98.1°F | Ht 71.0 in | Wt 275.0 lb

## 2014-03-11 DIAGNOSIS — E669 Obesity, unspecified: Secondary | ICD-10-CM | POA: Insufficient documentation

## 2014-03-11 DIAGNOSIS — Z0000 Encounter for general adult medical examination without abnormal findings: Secondary | ICD-10-CM

## 2014-03-11 NOTE — Progress Notes (Signed)
Pre visit review using our clinic review tool, if applicable. No additional management support is needed unless otherwise documented below in the visit note. 

## 2014-03-11 NOTE — Progress Notes (Signed)
Subjective:    Patient ID: Gary Mora, male    DOB: 05-09-1966, 48 y.o.   MRN: [id number]  HPI Patient seen for complete physical. He has history of obesity, obstructive sleep apnea, metabolic syndrome. He has been poorly compliant with diet and has gained about 13 pounds since last year. He drinks about 4 sodas per day. No consistent exercise. Still smokes. Tried Chantix last year which did not seem to help. He has given up alcohol together. He is considering Weight Watchers.  Past Medical History  Diagnosis Date  . Allergy   . Chicken pox   . Substance abuse   . Chronic kidney disease     kidney stone  . Headache(784.0)     tension after using the computer  . Sleep apnea     Had surgery to correct no cpapa used   Past Surgical History  Procedure Laterality Date  . Tonsillectomy  2006  . Uvulopalatopharyngoplasty  2005    reports that he has quit smoking. His smoking use included Cigarettes. He has a 30 pack-year smoking history. He has never used smokeless tobacco. He reports that he does not drink alcohol or use illicit drugs. family history includes Arthritis in his father and mother; Hypothyroidism in his sister. Allergies  Allergen Reactions  . Asa Buff (Mag [Buffered Aspirin]     hives      Review of Systems  Constitutional: Negative for fever, activity change, appetite change and fatigue.  HENT: Negative for congestion, ear pain and trouble swallowing.   Eyes: Negative for pain and visual disturbance.  Respiratory: Negative for cough, shortness of breath and wheezing.   Cardiovascular: Negative for chest pain and palpitations.  Gastrointestinal: Negative for nausea, vomiting, abdominal pain, diarrhea, constipation, blood in stool, abdominal distention and rectal pain.  Endocrine: Negative for polydipsia and polyuria.  Genitourinary: Negative for dysuria, hematuria and testicular pain.  Musculoskeletal: Negative for arthralgias and joint swelling.  Skin:  Negative for rash.  Neurological: Negative for dizziness, syncope and headaches.  Hematological: Negative for adenopathy.  Psychiatric/Behavioral: Negative for confusion and dysphoric mood.       Objective:   Physical Exam  Constitutional: He is oriented to person, place, and time. He appears well-developed and well-nourished. No distress.  HENT:  Head: Normocephalic and atraumatic.  Right Ear: External ear normal.  Left Ear: External ear normal.  Mouth/Throat: Oropharynx is clear and moist.  Eyes: Conjunctivae and EOM are normal. Pupils are equal, round, and reactive to light.  Neck: Normal range of motion. Neck supple. No thyromegaly present.  Cardiovascular: Normal rate, regular rhythm and normal heart sounds.   No murmur heard. Pulmonary/Chest: No respiratory distress. He has no wheezes. He has no rales.  Abdominal: Soft. Bowel sounds are normal. He exhibits no distension and no mass. There is no tenderness. There is no rebound and no guarding.  Musculoskeletal: He exhibits no edema.  Lymphadenopathy:    He has no cervical adenopathy.  Neurological: He is alert and oriented to person, place, and time. He displays normal reflexes. No cranial nerve deficit.  Skin: No rash noted.  Psychiatric: He has a normal mood and affect.          Assessment & Plan:  Complete physical. Patient has significant obesity and we discussed weight loss strategies. Reduce soda and sugar intake. Start more consistent exercise. We gave referral for trainer/health coach assistant. Labs reviewed. He has metabolic syndrome. We discussed implications. We discussed smoking cessation but current motivation is  low

## 2014-03-11 NOTE — Patient Instructions (Signed)
Dietary Guidelines to Help Prevent Kidney Stones Your risk of kidney stones can be decreased by adjusting the foods you eat. The most important thing you can do is drink enough fluid. You should drink enough fluid to keep your urine clear or pale yellow. The following guidelines provide specific information for the type of kidney stone you have had. GUIDELINES ACCORDING TO TYPE OF KIDNEY STONE Calcium Oxalate Kidney Stones  Reduce the amount of salt you eat. Foods that have a lot of salt cause your body to release excess calcium into your urine. The excess calcium can combine with a substance called oxalate to form kidney stones.  Reduce the amount of animal protein you eat if the amount you eat is excessive. Animal protein causes your body to release excess calcium into your urine. Ask your dietitian how much protein from animal sources you should be eating.  Avoid foods that are high in oxalates. If you take vitamins, they should have less than 500 mg of vitamin C. Your body turns vitamin C into oxalates. You do not need to avoid fruits and vegetables high in vitamin C. Calcium Phosphate Kidney Stones  Reduce the amount of salt you eat to help prevent the release of excess calcium into your urine.  Reduce the amount of animal protein you eat if the amount you eat is excessive. Animal protein causes your body to release excess calcium into your urine. Ask your dietitian how much protein from animal sources you should be eating.  Get enough calcium from food or take a calcium supplement (ask your dietitian for recommendations). Food sources of calcium that do not increase your risk of kidney stones include:  Broccoli.  Dairy products, such as cheese and yogurt.  Pudding. Uric Acid Kidney Stones  Do not have more than 6 oz of animal protein per day. FOOD SOURCES Animal Protein Sources  Meat (all types).  Poultry.  Eggs.  Fish, seafood. Foods High in [employer] seasonings.  Soy  sauce.  Teriyaki sauce.  Cured and processed meats.  Salted crackers and snack foods.  Fast food.  Canned soups and most canned foods. Foods High in Oxalates  Grains:  Amaranth.  Barley.  Grits.  Wheat germ.  Bran.  Buckwheat flour.  All bran cereals.  Pretzels.  Whole wheat bread.  Vegetables:  Beans (wax).  Beets and beet greens.  Collard greens.  Eggplant.  [place]scarole.  Leeks.  Okra.  Parsley.  Rutabagas.  Spinach.  Swiss chard.  Tomato paste.  Fried potatoes.  Sweet potatoes.  Fruits:  Red currants.  Figs.  Kiwi.  Rhubarb.  Meat and Other Protein Sources:  Beans (dried).  Soy burgers and other soybean products.  Miso.  Nuts (peanuts, almonds, pecans, cashews, hazelnuts).  Nut butters.  Sesame seeds and tahini (paste made of sesame seeds).  Poppy seeds.  Beverages:  Chocolate drink mixes.  Soy milk.  Instant iced tea.  Juices made from high-oxalate fruits or vegetables.  Other:  Carob.  Chocolate.  Fruitcake.  Marmalades. Document Released: 11/30/2010 Document Revised: 08/10/2013 Document Reviewed: 07/02/2013 [facility] Patient Information 2015 [place], [place]. This information is not intended to replace advice given to you by your health care provider. Make sure you discuss any questions you have with your health care provider. Food Choices to Lower Your Triglycerides  Triglycerides are a type of fat in your blood. High levels of triglycerides can increase the risk of heart disease and stroke. If your triglyceride levels are high, the foods  you eat and your eating habits are very important. Choosing the right foods can help lower your triglycerides.  WHAT GENERAL GUIDELINES DO I NEED TO FOLLOW?  Lose weight if you are overweight.   Limit or avoid alcohol.   Fill one half of your plate with vegetables and green salads.   Limit fruit to two servings a day. Choose fruit instead of juice.   Make  one fourth of your plate whole grains. Look for the word "whole" as the first word in the ingredient list.  Fill one fourth of your plate with lean protein foods.  Enjoy fatty fish (such as salmon, mackerel, sardines, and tuna) three times a week.   Choose healthy fats.   Limit foods high in starch and sugar.  Eat more home-cooked food and less restaurant, buffet, and fast food.  Limit fried foods.  Cook foods using methods other than frying.  Limit saturated fats.  Check ingredient lists to avoid foods with partially hydrogenated oils (trans fats) in them. WHAT FOODS CAN I EAT?  Grains Whole grains, such as whole wheat or whole grain breads, crackers, cereals, and pasta. Unsweetened oatmeal, bulgur, barley, quinoa, or brown rice. Corn or whole wheat flour tortillas.  Vegetables Fresh or frozen vegetables (raw, steamed, roasted, or grilled). Green salads. Fruits All fresh, canned (in natural juice), or frozen fruits. Meat and Other Protein Products Ground beef (85% or leaner), grass-fed beef, or beef trimmed of fat. Skinless chicken or [place]. Ground chicken or [place]. Pork trimmed of fat. All fish and seafood. Eggs. Dried beans, peas, or lentils. Unsalted nuts or seeds. Unsalted canned or dry beans. Dairy Low-fat dairy products, such as skim or 1% milk, 2% or reduced-fat cheeses, low-fat ricotta or cottage cheese, or plain low-fat yogurt. Fats and Oils Tub margarines without trans fats. Light or reduced-fat mayonnaise and salad dressings. Avocado. Safflower, olive, or canola oils. Natural peanut or almond butter. The items listed above may not be a complete list of recommended foods or beverages. Contact your dietitian for more options. WHAT FOODS ARE NOT RECOMMENDED?  Grains White bread. White pasta. White rice. Cornbread. Bagels, pastries, and croissants. Crackers that contain trans fat. Vegetables White potatoes. Corn. Creamed or fried vegetables. Vegetables in a cheese  sauce. Fruits Dried fruits. Canned fruit in light or heavy syrup. Fruit juice. Meat and Other Protein Products Fatty cuts of meat. Ribs, chicken wings, bacon, sausage, bologna, salami, chitterlings, fatback, hot dogs, bratwurst, and packaged luncheon meats. Dairy Whole or 2% milk, cream, half-and-half, and cream cheese. Whole-fat or sweetened yogurt. Full-fat cheeses. Nondairy creamers and whipped toppings. Processed cheese, cheese spreads, or cheese curds. Sweets and Desserts Corn syrup, sugars, honey, and molasses. Candy. Jam and jelly. Syrup. Sweetened cereals. Cookies, pies, cakes, donuts, muffins, and ice cream. Fats and Oils Butter, stick margarine, lard, shortening, ghee, or bacon fat. Coconut, palm kernel, or palm oils. Beverages Alcohol. Sweetened drinks (such as sodas, lemonade, and fruit drinks or punches). The items listed above may not be a complete list of foods and beverages to avoid. Contact your dietitian for more information. Document Released: 05/23/2004 Document Revised: 08/10/2013 Document Reviewed: 06/09/2013 [facility] Patient Information 2015 [place], [place]. This information is not intended to replace advice given to you by your health care provider. Make sure you discuss any questions you have with your health care provider.

## 2014-03-28 ENCOUNTER — Ambulatory Visit (INDEPENDENT_AMBULATORY_CARE_PROVIDER_SITE_OTHER): Payer: BC Managed Care – PPO | Admitting: Internal Medicine

## 2014-03-28 ENCOUNTER — Telehealth: Payer: Self-pay | Admitting: Family Medicine

## 2014-03-28 ENCOUNTER — Encounter: Payer: Self-pay | Admitting: Internal Medicine

## 2014-03-28 VITALS — BP 140/90 | HR 118 | Temp 98.0°F | Resp 20 | Ht 71.0 in | Wt 275.0 lb

## 2014-03-28 DIAGNOSIS — L0390 Cellulitis, unspecified: Secondary | ICD-10-CM

## 2014-03-28 DIAGNOSIS — L0291 Cutaneous abscess, unspecified: Secondary | ICD-10-CM

## 2014-03-28 MED ORDER — CEPHALEXIN 500 MG PO CAPS: 500.0000 mg | ORAL_CAPSULE | Freq: Four times a day (QID) | ORAL | Status: DC

## 2014-03-28 NOTE — Progress Notes (Signed)
Subjective:    Patient ID: Gary Mora, male    DOB: 08/19/1966, 48 y.o.   MRN: [id number]  HPI  48 year old patient who has a history of obesity, and OSA.  He also has a history of recurrent skin and soft tissue infections.  He has had a recent infection involving the left lower abdominal wall then opened and spontaneously drained.  Today he presents with a lesion in the right lower abdominal wall  that has become painful.  No fever or systemic complaints.  Past Medical History  Diagnosis Date  . Allergy   . Chicken pox   . Substance abuse   . Chronic kidney disease     kidney stone  . Headache(784.0)     tension after using the computer  . Sleep apnea     Had surgery to correct no cpapa used    History   Social History  . Marital Status: Divorced    Spouse Name: N/A    Number of Children: N/A  . Years of Education: N/A   Occupational History  . Not on file.   Social History Main Topics  . Smoking status: Former Smoker -- 1.00 packs/day for 30 years    Types: Cigarettes  . Smokeless tobacco: Never Used  . Alcohol Use: No  . Drug Use: No  . Sexual Activity: Not on file   Other Topics Concern  . Not on file   Social History Narrative  . No narrative on file    Past Surgical History  Procedure Laterality Date  . Tonsillectomy  2006  . Uvulopalatopharyngoplasty  2005    Family History  Problem Relation Age of Onset  . Arthritis Mother   . Arthritis Father   . Hypothyroidism Sister     Allergies  Allergen Reactions  . Asa Buff (Mag [Buffered Aspirin]     hives    Current Outpatient Prescriptions on File Prior to Visit  Medication Sig Dispense Refill  . Ascorbic Acid (VITAMIN C) 100 MG tablet Take 100 mg by mouth daily.        Marland Kitchen loratadine (CLARITIN) 10 MG tablet Take 10 mg by mouth daily.      . multivitamin-iron-minerals-folic acid (CENTRUM) chewable tablet Chew 1 tablet by mouth daily.        . naproxen sodium (ANAPROX) 220 MG tablet Take 220  mg by mouth 2 (two) times daily with a meal.      . sildenafil (VIAGRA) 100 MG tablet Take 1 tablet (100 mg total) by mouth as needed for erectile dysfunction.  6 tablet  11   No current facility-administered medications on file prior to visit.    BP 140/90  Pulse 118  Temp(Src) 98 F (36.7 C) (Oral)  Resp 20  Ht 5\' 11"  (1.803 m)  Wt 275 lb (124.739 kg)  BMI 38.37 kg/m2  SpO2 98%    Review of Systems  Skin: Positive for rash and wound.       Objective:   Physical Exam  Constitutional: He appears well-nourished. No distress.  Afebrile  Cardiovascular:  Repeat pulse rate 90-100  Skin:  1 x 2 cm patchy area of erythema involving the right lower abdominal wall area.  Area was slightly tender and indurated, but no fluctuance noted          Assessment & Plan:   Soft tissue infection right lower abdominal wall region.  Area is indurated, but not fluctuant- doubtful I&D would yield much exudate.  Will place  on warm compresses and antibiotic therapy.  He will call the clinical worsening  Contact information given for [facility] per patient request

## 2014-03-28 NOTE — Telephone Encounter (Signed)
Patient Information:  Caller Name: Dorsel  Phone: [phone]  Patient: Gary Mora, Gary Mora  Gender: Male  DOB: [birth year]  Age: 48 Years  PCP: Carolann Littler [facility])  Office Follow Up:  Does the office need to follow up with this patient?: No  Instructions For The Office: N/A  RN Note:  Patient calling regarding skin ulcer on stomach.   States "I have these that come and go, but this one is really painful, and doesn't seem to be going away."  Denies puss or red streaks.  Symptoms  Reason For Call & Symptoms: skin lesion  Reviewed Health History In EMR: Yes  Reviewed Medications In EMR: Yes  Reviewed Allergies In EMR: Yes  Reviewed Surgeries / Procedures: Yes  Date of Onset of Symptoms: 03/24/2014  Guideline(s) Used:  Skin Lesion - Moles or Growths  Disposition Per Guideline:   See Today in Office  Reason For Disposition Reached:   Looks like a boil, infected sore, or deep ulcer  Advice Given:  Call Back If:  You become worse.  Patient Will Follow Care Advice:  YES  Appointment Scheduled:  03/28/2014 16:15:52 Appointment Scheduled Provider:  Alysia Penna [facility])

## 2014-03-28 NOTE — Progress Notes (Signed)
Pre visit review using our clinic review tool, if applicable. No additional management support is needed unless otherwise documented below in the visit note. 

## 2014-03-28 NOTE — Patient Instructions (Signed)
Take your antibiotic as prescribed until ALL of it is gone, but stop if you develop a rash, swelling, or any side effects of the medication.  Contact our office as soon as possible if  there are side effects of the medication.  Abscess An abscess is an infected area that contains a collection of pus and debris.It can occur in almost any part of the body. An abscess is also known as a furuncle or boil. CAUSES  An abscess occurs when tissue gets infected. This can occur from blockage of oil or sweat glands, infection of hair follicles, or a minor injury to the skin. As the body tries to fight the infection, pus collects in the area and creates pressure under the skin. This pressure causes pain. People with weakened immune systems have difficulty fighting infections and get certain abscesses more often.  SYMPTOMS Usually an abscess develops on the skin and becomes a painful mass that is red, warm, and tender. If the abscess forms under the skin, you may feel a moveable soft area under the skin. Some abscesses break open (rupture) on their own, but most will continue to get worse without care. The infection can spread deeper into the body and eventually into the bloodstream, causing you to feel ill.  DIAGNOSIS  Your caregiver will take your medical history and perform a physical exam. A sample of fluid may also be taken from the abscess to determine what is causing your infection. TREATMENT  Your caregiver may prescribe antibiotic medicines to fight the infection. However, taking antibiotics alone usually does not cure an abscess. Your caregiver may need to make a small cut (incision) in the abscess to drain the pus. In some cases, gauze is packed into the abscess to reduce pain and to continue draining the area. HOME CARE INSTRUCTIONS   Only take over-the-counter or prescription medicines for pain, discomfort, or fever as directed by your caregiver.  If you were prescribed antibiotics, take them as  directed. Finish them even if you start to feel better.  If gauze is used, follow your caregiver's directions for changing the gauze.  To avoid spreading the infection:  Keep your draining abscess covered with a bandage.  Wash your hands well.  Do not share personal care items, towels, or whirlpools with others.  Avoid skin contact with others.  Keep your skin and clothes clean around the abscess.  Keep all follow-up appointments as directed by your caregiver. SEEK MEDICAL CARE IF:   You have increased pain, swelling, redness, fluid drainage, or bleeding.  You have muscle aches, chills, or a general ill feeling.  You have a fever. MAKE SURE YOU:   Understand these instructions.  Will watch your condition.  Will get help right away if you are not doing well or get worse. Document Released: 05/15/2005 Document Revised: 02/04/2012 Document Reviewed: 10/18/2011 [facility] Patient Information 2015 [place], [place]. This information is not intended to replace advice given to you by your health care provider. Make sure you discuss any questions you have with your health care provider.   Consider [employer] [facility] at [facility]: Call [phone]

## 2014-03-29 ENCOUNTER — Ambulatory Visit: Payer: Self-pay | Admitting: Family Medicine

## 2014-05-16 ENCOUNTER — Telehealth: Payer: Self-pay | Admitting: Family Medicine

## 2014-05-16 ENCOUNTER — Encounter: Payer: Self-pay | Admitting: Physician Assistant

## 2014-05-16 ENCOUNTER — Ambulatory Visit (INDEPENDENT_AMBULATORY_CARE_PROVIDER_SITE_OTHER): Payer: BC Managed Care – PPO | Admitting: Physician Assistant

## 2014-05-16 VITALS — BP 120/80 | HR 72 | Temp 97.7°F | Resp 18 | Wt 263.1 lb

## 2014-05-16 DIAGNOSIS — R319 Hematuria, unspecified: Secondary | ICD-10-CM

## 2014-05-16 DIAGNOSIS — K5900 Constipation, unspecified: Secondary | ICD-10-CM

## 2014-05-16 LAB — POCT URINALYSIS DIPSTICK
Bilirubin, UA: NEGATIVE
GLUCOSE UA: NEGATIVE
Ketones, UA: NEGATIVE
Nitrite, UA: NEGATIVE
SPEC GRAV UA: 1.02
Urobilinogen, UA: 0.2
pH, UA: 5.5

## 2014-05-16 NOTE — Telephone Encounter (Signed)
Patient Information:  Caller Name: Gershon Mussel  Phone: [phone]  Patient: Kenard, Morawski  Gender: Male  DOB: [birth year]  Age: 48 Years  PCP: Carolann Littler [facility])  Office Follow Up:  Does the office need to follow up with this patient?: Yes  Instructions For The Office: There is a 3:30pm appt for Dr Elease Hashimoto.  Office to ask MD if he can take this appt.  Office staff will call pt back with further information on appt time.   Symptoms  Reason For Call & Symptoms: Pt is calling and states that he is having constipation;  has had a BM but feels that he still has not gone completly; he is having  sharp pain to the left side area; dull pain but becomes sharp if he touches the area; rates 6/10; intermittent pain; no fever;  had some blood in urine over the weekend  Reviewed Health History In EMR: Yes  Reviewed Medications In EMR: Yes  Reviewed Allergies In EMR: Yes  Reviewed Surgeries / Procedures: Yes  Date of Onset of Symptoms: 05/12/2014  Guideline(s) Used:  Abdominal Pain - Male  Disposition Per Guideline:   Go to Office Now  Reason For Disposition Reached:   Blood in urine (red, pink, or tea-colored)  Advice Given:  Call Back If:  You become worse.  Patient Will Follow Care Advice:  YES

## 2014-05-16 NOTE — Progress Notes (Signed)
Subjective:    Patient ID: Gary Mora, male    DOB: 23-Jun-1966, 48 y.o.   MRN: [id number]  Hematuria This is a new problem. The current episode started in the past 7 days. The problem has been waxing and waning since onset. He describes the hematuria as gross hematuria. The hematuria occurs throughout his entire urinary stream. He reports no clotting in his urine stream. His pain is at a severity of 0/10. He is experiencing no pain. He describes his urine color as dark red. Irritative symptoms do not include frequency, nocturia or urgency. Obstructive symptoms do not include dribbling, incomplete emptying, an intermittent stream, a slower stream, straining or a weak stream. Associated symptoms include abdominal pain (left sided, ache), chills, flank pain and nausea (mild). Pertinent negatives include no dysuria, facial swelling, fever, genital pain, hesitancy, inability to urinate or vomiting. His past medical history is significant for kidney stones and tobacco use. There is no history of BPH, GU trauma, hypertension, prostatitis, recent infection, sickle cell disease or STDs.  Abdominal Pain This is a new problem. The current episode started in the past 7 days. The onset quality is undetermined. The problem occurs daily. The problem has been waxing and waning. The pain is located in the LUQ, LLQ and left flank. The pain is at a severity of 5/10. The quality of the pain is aching and sharp. The abdominal pain does not radiate. Associated symptoms include constipation, hematuria and nausea (mild). Pertinent negatives include no anorexia, arthralgias, belching, diarrhea, dysuria, fever, flatus, frequency, headaches, hematochezia, melena, myalgias, vomiting or weight loss. Treatments tried: dulcolax. The treatment provided no relief.      Review of Systems  Constitutional: Positive for chills. Negative for fever and weight loss.  HENT: Negative for facial swelling.   Respiratory: Negative for  shortness of breath.   Cardiovascular: Negative for chest pain.  Gastrointestinal: Positive for nausea (mild), abdominal pain (left sided, ache) and constipation. Negative for vomiting, diarrhea, melena, hematochezia, anorexia and flatus.  Genitourinary: Positive for hematuria and flank pain. Negative for dysuria, hesitancy, urgency, frequency, incomplete emptying and nocturia.  Musculoskeletal: Negative for arthralgias and myalgias.  Neurological: Negative for syncope and headaches.  All other systems reviewed and are negative.  Past Medical History  Diagnosis Date  . Allergy   . Chicken pox   . Substance abuse   . Chronic kidney disease     kidney stone  . Headache(784.0)     tension after using the computer  . Sleep apnea     Had surgery to correct no cpapa used    History   Social History  . Marital Status: Divorced    Spouse Name: N/A    Number of Children: N/A  . Years of Education: N/A   Occupational History  . Not on file.   Social History Main Topics  . Smoking status: Former Smoker -- 1.00 packs/day for 30 years    Types: Cigarettes  . Smokeless tobacco: Never Used  . Alcohol Use: No  . Drug Use: No  . Sexual Activity: Not on file   Other Topics Concern  . Not on file   Social History Narrative  . No narrative on file    Past Surgical History  Procedure Laterality Date  . Tonsillectomy  2006  . Uvulopalatopharyngoplasty  2005    Family History  Problem Relation Age of Onset  . Arthritis Mother   . Arthritis Father   . Hypothyroidism Sister  Allergies  Allergen Reactions  . Asa Buff (Mag [Buffered Aspirin]     hives    Current Outpatient Prescriptions on File Prior to Visit  Medication Sig Dispense Refill  . Ascorbic Acid (VITAMIN C) 100 MG tablet Take 100 mg by mouth daily.        Marland Kitchen loratadine (CLARITIN) 10 MG tablet Take 10 mg by mouth daily.      . multivitamin-iron-minerals-folic acid (CENTRUM) chewable tablet Chew 1 tablet by  mouth daily.        . naproxen sodium (ANAPROX) 220 MG tablet Take 220 mg by mouth 2 (two) times daily with a meal.      . sildenafil (VIAGRA) 100 MG tablet Take 1 tablet (100 mg total) by mouth as needed for erectile dysfunction.  6 tablet  11   No current facility-administered medications on file prior to visit.    EXAM: BP 120/80  Pulse 72  Temp(Src) 97.7 F (36.5 C) (Oral)  Resp 18  Wt 263 lb 1.6 oz (119.341 kg)     Objective:   Physical Exam  Nursing note and vitals reviewed. Constitutional: He is oriented to person, place, and time. He appears well-developed and well-nourished. No distress.  HENT:  Head: Normocephalic and atraumatic.  Eyes: Conjunctivae and EOM are normal. Pupils are equal, round, and reactive to light.  Cardiovascular: Normal rate, regular rhythm and intact distal pulses.   Pulmonary/Chest: Effort normal and breath sounds normal. No respiratory distress. He has no wheezes. He has no rales. He exhibits no tenderness.  No CVA ttp.  Abdominal: Soft. Bowel sounds are normal. He exhibits no distension and no mass. There is tenderness (mild, Lmid and LLQ.). There is no rebound and no guarding.  Neurological: He is alert and oriented to person, place, and time.  Skin: Skin is warm and dry. No rash noted. He is not diaphoretic. No erythema. No pallor.  Psychiatric: He has a normal mood and affect. His behavior is normal. Judgment and thought content normal.     Lab Results  Component Value Date   WBC 10.2 03/03/2014   HGB 16.4 03/03/2014   HCT 48.7 03/03/2014   PLT 425.0* 03/03/2014   GLUCOSE 95 03/03/2014   CHOL 169 03/03/2014   TRIG 326.0* 03/03/2014   HDL 22.10* 03/03/2014   LDLDIRECT 102.4 08/20/2012   LDLCALC 82 03/03/2014   ALT 27 03/03/2014   AST 29 03/03/2014   NA 135 03/03/2014   K 4.0 03/03/2014   CL 98 03/03/2014   CREATININE 0.7 03/03/2014   BUN 15 03/03/2014   CO2 26 03/03/2014   TSH 0.74 03/03/2014        Assessment & Plan:  Gary Mora was seen today  for hematuria and sharp paiin on left side; constipation.  Diagnoses and associated orders for this visit:  Hematuria Comments: UA with blood and leuks, obtain culture, push fluids, repeat UA in 2 weeks if culture negative. - POCT urinalysis dipstick; Standing - POCT urinalysis dipstick - Culture, Urine  Unspecified constipation Comments: Trial of miralax to help symptoms, watchful waiting.    Return precautions provided, and patient handout on Abd pain and hematuria.  Plan to follow up as needed, or for worsening or persistent symptoms despite treatment.  Patient Instructions  Try taking miralax to help with you constipation symptoms.  Continue to push fluid hydration.  We will culture your urine to see if any infection grows, and treat if needed.  If your urine culture is clear, we will have  a repeat urine done in 2 weeks to make sure no more blood is present.  If emergency symptoms discussed during visit developed, seek medical attention immediately.  Followup as needed, or for worsening or persistent symptoms despite treatment.

## 2014-05-16 NOTE — Telephone Encounter (Signed)
Appt scheduled with Kela Millin, PA at 3pm today.

## 2014-05-16 NOTE — Patient Instructions (Addendum)
Try taking miralax to help with you constipation symptoms.  Continue to push fluid hydration.  We will culture your urine to see if any infection grows, and treat if needed.  If your urine culture is clear, we will have a repeat urine done in 2 weeks to make sure no more blood is present.  If emergency symptoms discussed during visit developed, seek medical attention immediately.  Followup as needed, or for worsening or persistent symptoms despite treatment.   Abdominal Pain Many things can cause belly (abdominal) pain. Most times, the belly pain is not dangerous. Many cases of belly pain can be watched and treated at home. HOME CARE   Do not take medicines that help you go poop (laxatives) unless told to by your doctor.  Only take medicine as told by your doctor.  Eat or drink as told by your doctor. Your doctor will tell you if you should be on a special diet. GET HELP IF:  You do not know what is causing your belly pain.  You have belly pain while you are sick to your stomach (nauseous) or have runny poop (diarrhea).  You have pain while you pee or poop.  Your belly pain wakes you up at night.  You have belly pain that gets worse or better when you eat.  You have belly pain that gets worse when you eat fatty foods.  You have a fever. GET HELP RIGHT AWAY IF:   The pain does not go away within 2 hours.  You keep throwing up (vomiting).  The pain changes and is only in the right or left part of the belly.  You have bloody or tarry looking poop. MAKE SURE YOU:   Understand these instructions.  Will watch your condition.  Will get help right away if you are not doing well or get worse. Document Released: 01/22/2008 Document Revised: 08/10/2013 Document Reviewed: 04/14/2013 [facility] Patient Information 2015 [place], [place]. This information is not intended to replace advice given to you by your health care provider. Make sure you discuss any questions you have with  your health care provider. Hematuria Hematuria is blood in your urine. It can be caused by a bladder infection, kidney infection, prostate infection, kidney stone, or cancer of your urinary tract. Infections can usually be treated with medicine, and a kidney stone usually will pass through your urine. If neither of these is the cause of your hematuria, further workup to find out the reason may be needed. It is very important that you tell your health care provider about any blood you see in your urine, even if the blood stops without treatment or happens without causing pain. Blood in your urine that happens and then stops and then happens again can be a symptom of a very serious condition. Also, pain is not a symptom in the initial stages of many urinary cancers. HOME CARE INSTRUCTIONS   Drink lots of fluid, 3-4 quarts a day. If you have been diagnosed with an infection, cranberry juice is especially recommended, in addition to large amounts of water.  Avoid caffeine, tea, and carbonated beverages because they tend to irritate the bladder.  Avoid alcohol because it may irritate the prostate.  Take all medicines as directed by your health care provider.  If you were prescribed an antibiotic medicine, finish it all even if you start to feel better.  If you have been diagnosed with a kidney stone, follow your health care provider's instructions regarding straining your urine to catch  the stone.  Empty your bladder often. Avoid holding urine for long periods of time.  After a bowel movement, women should cleanse front to back. Use each tissue only once.  Empty your bladder before and after sexual intercourse if you are a male. SEEK MEDICAL CARE IF:  You develop back pain.  You have a fever.  You have a feeling of sickness in your stomach (nausea) or vomiting.  Your symptoms are not better in 3 days. Return sooner if you are getting worse. SEEK IMMEDIATE MEDICAL CARE IF:   You develop  severe vomiting and are unable to keep the medicine down.  You develop severe back or abdominal pain despite taking your medicines.  You begin passing a large amount of blood or clots in your urine.  You feel extremely weak or faint, or you pass out. MAKE SURE YOU:   Understand these instructions.  Will watch your condition.  Will get help right away if you are not doing well or get worse. Document Released: 08/05/2005 Document Revised: 12/20/2013 Document Reviewed: 04/05/2013 [facility] Patient Information 2015 [place], [place]. This information is not intended to replace advice given to you by your health care provider. Make sure you discuss any questions you have with your health care provider.

## 2014-05-16 NOTE — Progress Notes (Signed)
Pre visit review using our clinic review tool, if applicable. No additional management support is needed unless otherwise documented below in the visit note. 

## 2014-05-23 ENCOUNTER — Other Ambulatory Visit: Payer: Self-pay

## 2014-05-23 ENCOUNTER — Telehealth: Payer: Self-pay

## 2014-05-23 DIAGNOSIS — R319 Hematuria, unspecified: Secondary | ICD-10-CM

## 2014-05-23 NOTE — Telephone Encounter (Signed)
Per Rodman Key repeat in 2 weeks from office date.

## 2014-05-23 NOTE — Telephone Encounter (Signed)
Left a message for pt to call office; calling to see how pt is feeling and if symptoms have improved.

## 2014-05-23 NOTE — Telephone Encounter (Signed)
Spoke with pt and pt states he is feeling better.  Pt aware he needs a repeat urine specimen next week.  Pt aware of appt on 05/30/14.

## 2014-05-30 ENCOUNTER — Other Ambulatory Visit (INDEPENDENT_AMBULATORY_CARE_PROVIDER_SITE_OTHER): Payer: BC Managed Care – PPO

## 2014-05-30 ENCOUNTER — Other Ambulatory Visit: Payer: Self-pay | Admitting: Physician Assistant

## 2014-05-30 DIAGNOSIS — R319 Hematuria, unspecified: Secondary | ICD-10-CM

## 2014-05-30 LAB — URINALYSIS, ROUTINE W REFLEX MICROSCOPIC
Bilirubin Urine: NEGATIVE
KETONES UR: NEGATIVE
LEUKOCYTES UA: NEGATIVE
Nitrite: NEGATIVE
SPECIFIC GRAVITY, URINE: 1.015 (ref 1.000–1.030)
Total Protein, Urine: NEGATIVE
UROBILINOGEN UA: 0.2 (ref 0.0–1.0)
Urine Glucose: 100 — AB
pH: 7 (ref 5.0–8.0)

## 2014-06-01 ENCOUNTER — Encounter: Payer: Self-pay | Admitting: Family Medicine

## 2014-06-01 LAB — URINE CULTURE
COLONY COUNT: NO GROWTH
ORGANISM ID, BACTERIA: NO GROWTH

## 2014-07-05 ENCOUNTER — Encounter: Payer: Self-pay | Admitting: Family Medicine

## 2014-07-05 ENCOUNTER — Ambulatory Visit (INDEPENDENT_AMBULATORY_CARE_PROVIDER_SITE_OTHER): Payer: BC Managed Care – PPO | Admitting: Family Medicine

## 2014-07-05 VITALS — BP 130/80 | HR 106 | Temp 98.3°F | Wt 267.0 lb

## 2014-07-05 DIAGNOSIS — L02211 Cutaneous abscess of abdominal wall: Secondary | ICD-10-CM

## 2014-07-05 MED ORDER — DOXYCYCLINE HYCLATE 100 MG PO CAPS
100.0000 mg | ORAL_CAPSULE | Freq: Two times a day (BID) | ORAL | Status: DC
Start: 2014-07-05 — End: 2014-09-14

## 2014-07-05 NOTE — Progress Notes (Signed)
Pre visit review using our clinic review tool, if applicable. No additional management support is needed unless otherwise documented below in the visit note. 

## 2014-07-05 NOTE — Patient Instructions (Signed)

## 2014-07-05 NOTE — Progress Notes (Signed)
   Subjective:    Patient ID: Gary Mora, male    DOB: [birth year], 48 y.o.   MRN: [id number]  HPI Patient resents with large "bole" on his waist. Present for about a month. He thinks this may have drained some. No fever or chills. Very tender to palpation. Has tried heat topically without much improvement. No known history of MRSA.  Past Medical History  Diagnosis Date  . Allergy   . Chicken pox   . Substance abuse   . Chronic kidney disease     kidney stone  . Headache(784.0)     tension after using the computer  . Sleep apnea     Had surgery to correct no cpapa used   Past Surgical History  Procedure Laterality Date  . Tonsillectomy  2006  . Uvulopalatopharyngoplasty  2005    reports that he has quit smoking. His smoking use included Cigarettes. He has a 30 pack-year smoking history. He has never used smokeless tobacco. He reports that he does not drink alcohol or use illicit drugs. family history includes Arthritis in his father and mother; Hypothyroidism in his sister. Allergies  Allergen Reactions  . Asa Buff (Mag [Buffered Aspirin]     hives      Review of Systems  Constitutional: Negative for fever and chills.       Objective:   Physical Exam  Constitutional: He appears well-developed and well-nourished.  Skin:  Right lower abdominal region reveals area of erythema about 3 x 3 cm. New the center he has some fluctuance and tenderness to palpation. Minimal induration          Assessment & Plan:  Absent right lower abdomen and suprapubic region. We've recommended incision and drainage and patient consented-after discussing risk and benefits. Skin prepped with Betadine. Anesthetized with 2% plain Xylocaine. Using #11 blade linear incision made over area of fluctuance. Minimal purulence expressed. We used hemostats to explore he did have a cavity which suggests that he has had increased pus recently that had drained already.doxycycline 100 mg twice a day for 10  days.  Wound cavity is packed with gauze and topical antibiotic and outer dressing applied. Return in 2 days for packing removal and reassessment

## 2014-07-07 ENCOUNTER — Encounter: Payer: Self-pay | Admitting: Family Medicine

## 2014-07-07 ENCOUNTER — Ambulatory Visit (INDEPENDENT_AMBULATORY_CARE_PROVIDER_SITE_OTHER): Payer: BC Managed Care – PPO | Admitting: Family Medicine

## 2014-07-07 VITALS — BP 130/86 | HR 106 | Temp 97.7°F | Wt 267.0 lb

## 2014-07-07 DIAGNOSIS — L02211 Cutaneous abscess of abdominal wall: Secondary | ICD-10-CM

## 2014-07-07 NOTE — Progress Notes (Signed)
   Subjective:    Patient ID: Gary Mora, male    DOB: Jul 19, 1966, 48 y.o.   MRN: [id number]  HPI   Patient here for follow-up lower abdominal abscess. Incision drainage 2 days ago. We got out minimal purulence.  Wound had a fairly large cavity that was packed. He has seen significant purulent drainage since then. He still has significant pain but overall redness is actually improved. No fevers or chills. We placed on doxycycline. He is taking this without side effect.  Past Medical History  Diagnosis Date  . Allergy   . Chicken pox   . Substance abuse   . Chronic kidney disease     kidney stone  . Headache(784.0)     tension after using the computer  . Sleep apnea     Had surgery to correct no cpapa used   Past Surgical History  Procedure Laterality Date  . Tonsillectomy  2006  . Uvulopalatopharyngoplasty  2005    reports that he has quit smoking. His smoking use included Cigarettes. He has a 30 pack-year smoking history. He has never used smokeless tobacco. He reports that he does not drink alcohol or use illicit drugs. family history includes Arthritis in his father and mother; Hypothyroidism in his sister. Allergies  Allergen Reactions  . Asa Buff (Mag [Buffered Aspirin]     hives      Review of Systems  Constitutional: Negative for fever and chills.       Objective:   Physical Exam  Constitutional: He appears well-developed and well-nourished.  Skin:  Incision mid lower abdominal region inspected. He has less surrounding erythema and no fluctuance and very minimal induration. Packing is removed. He does have some minimal purulent drainage. Wound cavity is irrigated. We recommended repacking. Anesthesia with 1% plain Xylocaine. Repacked with quarter inch iodoform gauze. Antibiotic and outer dressing applied          Assessment & Plan:  Abscess lower abdomen. He is still having some pain but clinically this looks improved. Repacked as above. Pull packing in 2  days. Should be able to leave out that point and treat topically with heat and keep clean with soap and water. Follow-up for any recurrent redness or swelling

## 2014-07-07 NOTE — Progress Notes (Signed)
Pre visit review using our clinic review tool, if applicable. No additional management support is needed unless otherwise documented below in the visit note. 

## 2014-07-07 NOTE — Patient Instructions (Signed)
Pull packing in 2 days Then apply heat twice daily Keep wound dry after showering  Apply topical antibiotic until fully healed.

## 2014-09-14 ENCOUNTER — Encounter: Payer: Self-pay | Admitting: Family Medicine

## 2014-09-14 ENCOUNTER — Ambulatory Visit (INDEPENDENT_AMBULATORY_CARE_PROVIDER_SITE_OTHER): Payer: Managed Care, Other (non HMO) | Admitting: Family Medicine

## 2014-09-14 VITALS — BP 130/80 | HR 104 | Temp 97.6°F | Wt 268.0 lb

## 2014-09-14 DIAGNOSIS — L03116 Cellulitis of left lower limb: Secondary | ICD-10-CM

## 2014-09-14 MED ORDER — CEPHALEXIN 500 MG PO CAPS: 500.0000 mg | ORAL_CAPSULE | Freq: Three times a day (TID) | ORAL | Status: DC

## 2014-09-14 NOTE — Progress Notes (Signed)
Pre visit review using our clinic review tool, if applicable. No additional management support is needed unless otherwise documented below in the visit note. 

## 2014-09-14 NOTE — Progress Notes (Signed)
   Subjective:    Patient ID: Gary Mora, male    DOB: 18-Aug-1966, 49 y.o.   MRN: [id number]  HPI Patient seen with possible cellulitis left foot. He developed about a week ago blister on the foot which he thought was due to some shoes. Over the past couple days had some redness around this region with soreness. No fevers or chills. He has no history of diabetes. No history of neuropathy issues. No history of MRSA  Past Medical History  Diagnosis Date  . Allergy   . Chicken pox   . Substance abuse   . Chronic kidney disease     kidney stone  . Headache(784.0)     tension after using the computer  . Sleep apnea     Had surgery to correct no cpapa used   Past Surgical History  Procedure Laterality Date  . Tonsillectomy  2006  . Uvulopalatopharyngoplasty  2005    reports that he has quit smoking. His smoking use included Cigarettes. He has a 30 pack-year smoking history. He has never used smokeless tobacco. He reports that he does not drink alcohol or use illicit drugs. family history includes Arthritis in his father and mother; Hypothyroidism in his sister. Allergies  Allergen Reactions  . Asa Buff (Mag [Buffered Aspirin]     hives      Review of Systems  Constitutional: Negative for fever and chills.       Objective:   Physical Exam  Constitutional: He appears well-developed and well-nourished.  Cardiovascular: Normal rate and regular rhythm.   Skin:  Left foot along the mid sole medially reveals proximally one by one half centimeter area of recent blister. He has proximally 3 x 4 surrounding zone of very minimal erythema without warmth. Minimally tender.          Assessment & Plan:  Probable early cellulitis left foot surrounding region of recent blister. Keflex 500 mg 3 times a day for 10 days. Keep clean with soap and water. Touch base if not resolving over the next week and sooner prn.cellulitis

## 2014-09-14 NOTE — Patient Instructions (Signed)

## 2015-03-04 ENCOUNTER — Encounter: Payer: Self-pay | Admitting: Family Medicine

## 2015-03-04 ENCOUNTER — Ambulatory Visit (INDEPENDENT_AMBULATORY_CARE_PROVIDER_SITE_OTHER): Payer: Managed Care, Other (non HMO) | Admitting: Family Medicine

## 2015-03-04 VITALS — BP 130/90 | HR 107 | Temp 97.8°F | Ht 71.0 in | Wt 259.0 lb

## 2015-03-04 DIAGNOSIS — R1032 Left lower quadrant pain: Secondary | ICD-10-CM

## 2015-03-04 MED ORDER — CIPROFLOXACIN HCL 500 MG PO TABS: 500.0000 mg | ORAL_TABLET | Freq: Two times a day (BID) | ORAL | Status: DC

## 2015-03-04 MED ORDER — METRONIDAZOLE 500 MG PO TABS: 500.0000 mg | ORAL_TABLET | Freq: Two times a day (BID) | ORAL | Status: AC

## 2015-03-04 NOTE — Patient Instructions (Addendum)
Good to see you.  I think your stomach is a small infecton Cipro for 2 times a day for 10 days Flagyl 2 times a day for 7 days Bananas, rice apples and toast first and then go from there Usually pears, plums, peaches can help with constipation Consider colace 100mg  daily If worsening symptoms go to emergency room Follow up with PCP in 10 days.   Diverticulitis Diverticulitis is inflammation or infection of small pouches in your colon that form when you have a condition called diverticulosis. The pouches in your colon are called diverticula. Your colon, or large intestine, is where water is absorbed and stool is formed. Complications of diverticulitis can include:  Bleeding.  Severe infection.  Severe pain.  Perforation of your colon.  Obstruction of your colon. CAUSES  Diverticulitis is caused by bacteria. Diverticulitis happens when stool becomes trapped in diverticula. This allows bacteria to grow in the diverticula, which can lead to inflammation and infection. RISK FACTORS People with diverticulosis are at risk for diverticulitis. Eating a diet that does not include enough fiber from fruits and vegetables may make diverticulitis more likely to develop. SYMPTOMS  Symptoms of diverticulitis may include:  Abdominal pain and tenderness. The pain is normally located on the left side of the abdomen, but may occur in other areas.  Fever and chills.  Bloating.  Cramping.  Nausea.  Vomiting.  Constipation.  Diarrhea.  Blood in your stool. DIAGNOSIS  Your health care provider will ask you about your medical history and do a physical exam. You may need to have tests done because many medical conditions can cause the same symptoms as diverticulitis. Tests may include:  Blood tests.  Urine tests.  Imaging tests of the abdomen, including X-rays and CT scans. When your condition is under control, your health care provider may recommend that you have a colonoscopy. A  colonoscopy can show how severe your diverticula are and whether something else is causing your symptoms. TREATMENT  Most cases of diverticulitis are mild and can be treated at home. Treatment may include:  Taking over-the-counter pain medicines.  Following a clear liquid diet.  Taking antibiotic medicines by mouth for 7-10 days. More severe cases may be treated at a hospital. Treatment may include:  Not eating or drinking.  Taking prescription pain medicine.  Receiving antibiotic medicines through an IV tube.  Receiving fluids and nutrition through an IV tube.  Surgery. HOME CARE INSTRUCTIONS   Follow your health care provider's instructions carefully.  Follow a full liquid diet or other diet as directed by your health care provider. After your symptoms improve, your health care provider may tell you to change your diet. He or she may recommend you eat a high-fiber diet. Fruits and vegetables are good sources of fiber. Fiber makes it easier to pass stool.  Take fiber supplements or probiotics as directed by your health care provider.  Only take medicines as directed by your health care provider.  Keep all your follow-up appointments. SEEK MEDICAL CARE IF:   Your pain does not improve.  You have a hard time eating food.  Your bowel movements do not return to normal. SEEK IMMEDIATE MEDICAL CARE IF:   Your pain becomes worse.  Your symptoms do not get better.  Your symptoms suddenly get worse.  You have a fever.  You have repeated vomiting.  You have bloody or black, tarry stools. MAKE SURE YOU:   Understand these instructions.  Will watch your condition.  Will get help  right away if you are not doing well or get worse. Document Released: 05/15/2005 Document Revised: 08/10/2013 Document Reviewed: 06/30/2013 [facility] Patient Information 2015 [place], [place]. This information is not intended to replace advice given to you by your health care provider. Make sure  you discuss any questions you have with your health care provider.

## 2015-03-04 NOTE — Assessment & Plan Note (Signed)
Patient's pain seems to be more associated with a possible diverticulitis. Patient is not showing any fever at this time. I do not feel that labs are necessary or would change my management at this time. Patient knows if any worsening symptoms, bright red blood per rectum or tarry stools patient will seek medical attention in the emergency department immediately. Patient given handout as well as prescriptions for Cipro as well as Flagyl. Patient will follow-up with primary care provider in 10 days.

## 2015-03-04 NOTE — Progress Notes (Signed)
  [facility] [address] [place], [place] [address] Phone: [phone] Subjective:     CC: Stomach pain  ENI:[id number] Gary Mora is a 49 y.o. male coming in with complaint of stomach pain. Patient states that this pain seems to be localized in the left lower quadrant. Has been worsening over the course last 3 days. Patient has had significant constipation he states. Patient did have a bowel movement today which was unfortunate hard and minor discoloration when she would state is hard for the usual. Denies any blood. Patient denies any fever but just does not feel like himself. Patient does have a decreased appetite but has been able to take in fluids. Patient did eat something this morning for breakfast. Denies that this is the same pain as his previous kidney stones. Has not had any contact with anyone else's been sick recently.     Past Medical History  Diagnosis Date  . Allergy   . Chicken pox   . Substance abuse   . Chronic kidney disease     kidney stone  . Headache(784.0)     tension after using the computer  . Sleep apnea     Had surgery to correct no cpapa used   Past Surgical History  Procedure Laterality Date  . Tonsillectomy  2006  . Uvulopalatopharyngoplasty  2005   History  Substance Use Topics  . Smoking status: Former Smoker -- 1.00 packs/day for 30 years    Types: Cigarettes  . Smokeless tobacco: Never Used  . Alcohol Use: No   Allergies  Allergen Reactions  . Asa Buff (Mag [Buffered Aspirin]     hives     Past medical history, social, surgical and family history all reviewed in electronic medical record.   Review of Systems: No headache, visual changes, nausea, vomiting, diarrhea, constipation, dizziness, abdominal pain, skin rash, fevers, chills, night sweats, weight loss, swollen lymph nodes, body aches, joint swelling, muscle aches, chest pain, shortness of breath, mood changes.   Objective Blood pressure 130/90,  pulse 107, temperature 97.8 F (36.6 C), temperature source Oral, height 5\' 11"  (1.803 m), weight 259 lb (117.482 kg), SpO2 96 %.  General: No apparent distress alert and oriented x3 mood and affect normal, dressed appropriately.  HEENT: Pupils equal, extraocular movements intact  Respiratory: Patient's speak in full sentences and does not appear short of breath  Cardiovascular: No lower extremity edema, non tender, no erythema  Skin: Warm dry intact with no signs of infection or rash on extremities or on axial skeleton.  Abdomen: Soft. Patient does have involuntary guarding to palpation in the left lower quadrant. Negative rebound tenderness. No masses palpated. Bowel sounds positive in all 4 quadrants. Minor discomfort at the umbilical area. Neuro: Cranial nerves II through XII are intact, neurovascularly intact in all extremities with 2+ DTRs and 2+ pulses.  Lymph: No lymphadenopathy of posterior or anterior cervical chain or axillae bilaterally.  Gait normal with good balance and coordination.  MSK:  Non tender with full range of motion and good stability and symmetric strength and tone of shoulders, elbows, wrist, hip, knee and ankles bilaterally.     Impression and Recommendations:     This case required medical decision making of moderate complexity.

## 2015-07-05 ENCOUNTER — Ambulatory Visit (INDEPENDENT_AMBULATORY_CARE_PROVIDER_SITE_OTHER): Payer: Managed Care, Other (non HMO) | Admitting: Family Medicine

## 2015-07-05 ENCOUNTER — Encounter: Payer: Self-pay | Admitting: Family Medicine

## 2015-07-05 VITALS — BP 136/90 | HR 100 | Temp 98.3°F | Ht 71.0 in | Wt 265.9 lb

## 2015-07-05 DIAGNOSIS — J988 Other specified respiratory disorders: Secondary | ICD-10-CM | POA: Diagnosis not present

## 2015-07-05 DIAGNOSIS — R030 Elevated blood-pressure reading, without diagnosis of hypertension: Secondary | ICD-10-CM

## 2015-07-05 DIAGNOSIS — F17200 Nicotine dependence, unspecified, uncomplicated: Secondary | ICD-10-CM

## 2015-07-05 DIAGNOSIS — Z720 Tobacco use: Secondary | ICD-10-CM

## 2015-07-05 DIAGNOSIS — IMO0001 Reserved for inherently not codable concepts without codable children: Secondary | ICD-10-CM

## 2015-07-05 DIAGNOSIS — E669 Obesity, unspecified: Secondary | ICD-10-CM

## 2015-07-05 MED ORDER — DOXYCYCLINE HYCLATE 100 MG PO CAPS: 100.0000 mg | ORAL_CAPSULE | Freq: Two times a day (BID) | ORAL | Status: DC

## 2015-07-05 NOTE — Patient Instructions (Addendum)
BEFORE YOU LEAVE: -schedule follow up with your doctor in 1 month regarding smoking and blood pressure  Take the antibiotic as instructed  Please quit smoking  Follow up sooner if symptoms worsen, persist or for other concerns

## 2015-07-05 NOTE — Progress Notes (Signed)
HPI:  URI -started: 1 week ago, now worse the last 2 days -symptoms:nasal congestion, sore throat, cough --> bilat maxillary sinus apin and reports had temp 101 yesterday -denies:SOB, NVD, hemoptysis, diarrhea, abd pain, nausea -has tried: nyquil -sick contacts/travel/risks: denies flu exposure, tick exposure or or Ebola risks -Hx of: allergies, smoking -on review of chart pulse and BP elevated in the past at doctor visits - he reports this is always the case, reports he checks his pulse and BP at home on a regular basis and pulse is usually 80 and BP is in 120s/80s  ROS: See pertinent positives and negatives per HPI.  Past Medical History  Diagnosis Date  . Allergy   . Chicken pox   . Substance abuse   . Chronic kidney disease     kidney stone  . Headache(784.0)     tension after using the computer  . Sleep apnea     Had surgery to correct no cpapa used    Past Surgical History  Procedure Laterality Date  . Tonsillectomy  2006  . Uvulopalatopharyngoplasty  2005    Family History  Problem Relation Age of Onset  . Arthritis Mother   . Arthritis Father   . Hypothyroidism Sister     Social History   Social History  . Marital Status: Divorced    Spouse Name: N/A  . Number of Children: N/A  . Years of Education: N/A   Social History Main Topics  . Smoking status: Former Smoker -- 1.00 packs/day for 30 years    Types: Cigarettes  . Smokeless tobacco: Never Used  . Alcohol Use: No  . Drug Use: No  . Sexual Activity: Not Asked   Other Topics Concern  . None   Social History Narrative     Current outpatient prescriptions:  .  Ascorbic Acid (VITAMIN C) 100 MG tablet, Take 100 mg by mouth daily.  , Disp: , Rfl:  .  loratadine (CLARITIN) 10 MG tablet, Take 10 mg by mouth daily., Disp: , Rfl:  .  multivitamin-iron-minerals-folic acid (CENTRUM) chewable tablet, Chew 1 tablet by mouth daily.  , Disp: , Rfl:  .  naproxen sodium (ANAPROX) 220 MG tablet, Take 220  mg by mouth 2 (two) times daily with a meal., Disp: , Rfl:  .  sildenafil (VIAGRA) 100 MG tablet, Take 1 tablet (100 mg total) by mouth as needed for erectile dysfunction., Disp: 6 tablet, Rfl: 11 .  doxycycline (VIBRAMYCIN) 100 MG capsule, Take 1 capsule (100 mg total) by mouth 2 (two) times daily., Disp: 20 capsule, Rfl: 0  EXAM:  Filed Vitals:   07/05/15 1004  BP: 136/90  Pulse: 100  Temp: 98.3 F (36.8 C)    Body mass index is 37.1 kg/(m^2).  GENERAL: vitals reviewed and listed above, alert, oriented, appears well hydrated and in no acute distress  HEENT: atraumatic, conjunttiva clear, no obvious abnormalities on inspection of external nose and ears, normal appearance of ear canals and TMs, thick nasal congestion, mild post oropharyngeal erythema with PND, no tonsillar edema or exudate, no sinus TTP  NECK: no obvious masses on inspection  LUNGS: clear to auscultation bilaterally, no wheezes, rales or rhonchi, good air movement  CV: HRRR, no peripheral edema  MS: moves all extremities without noticeable abnormality  PSYCH: pleasant and cooperative, no obvious depression or anxiety  ASSESSMENT AND PLAN:  Discussed the following assessment and plan:  Respiratory infection  Smoking  Elevated BP  Obesity (BMI 30-39.9)  -We discussed  potential etiologies, with sinusitis, possibly bacterial, being most likely. We discussed treatment side effects, likely course, antibiotic misuse, transmission, and signs of developing a serious illness. -advised to quit smoking and offered help -BP improved on recheck but still borderline - he reports is always fine at home, advised avoidance on nsiads, quit smoking, healthy lifestyle and follow up with PCP in 1 month to recheck -of course, we advised to return or notify a doctor immediately if symptoms worsen or persist or new concerns arise.    Patient Instructions  BEFORE YOU LEAVE: -schedule follow up with your doctor in 1 month  regarding smoking and blood pressure  Take the antibiotic as instructed  Please quit smoking  Follow up sooner if symptoms worsen, persist or for other concerns     Chance Munter R.

## 2015-07-05 NOTE — Progress Notes (Signed)
Pre visit review using our clinic review tool, if applicable. No additional management support is needed unless otherwise documented below in the visit note. 

## 2015-07-11 ENCOUNTER — Telehealth: Payer: Self-pay | Admitting: Family Medicine

## 2015-07-11 ENCOUNTER — Ambulatory Visit (INDEPENDENT_AMBULATORY_CARE_PROVIDER_SITE_OTHER): Payer: Managed Care, Other (non HMO) | Admitting: Family Medicine

## 2015-07-11 ENCOUNTER — Encounter: Payer: Self-pay | Admitting: Family Medicine

## 2015-07-11 VITALS — BP 150/84 | HR 115 | Temp 97.9°F | Wt 260.0 lb

## 2015-07-11 DIAGNOSIS — R51 Headache: Secondary | ICD-10-CM | POA: Diagnosis not present

## 2015-07-11 DIAGNOSIS — R5383 Other fatigue: Secondary | ICD-10-CM | POA: Diagnosis not present

## 2015-07-11 DIAGNOSIS — R002 Palpitations: Secondary | ICD-10-CM

## 2015-07-11 DIAGNOSIS — I10 Essential (primary) hypertension: Secondary | ICD-10-CM | POA: Diagnosis not present

## 2015-07-11 DIAGNOSIS — R519 Headache, unspecified: Secondary | ICD-10-CM

## 2015-07-11 MED ORDER — AMLODIPINE BESYLATE 5 MG PO TABS
5.0000 mg | ORAL_TABLET | Freq: Every day | ORAL | Status: DC
Start: 2015-07-11 — End: 2015-10-07

## 2015-07-11 NOTE — Progress Notes (Signed)
Garret Reddish, MD  Subjective:  Gary Mora is a 49 y.o. year old very pleasant male patient who presents for/with See problem oriented charting ROS- No CP, shortness of breath, blurry vision at distance- some trouble reading at times if staring at computer long time- otherwise really no issues. No fevers, chills, nausea, vomiting. Treated for sinus infection last week and has done better since that time.   Past Medical History-  Patient Active Problem List   Diagnosis Date Noted  . Essential hypertension 07/11/2015  . Abdominal pain, left lower quadrant 03/04/2015  . Obesity (BMI 30-39.9) 03/11/2014  . Renal calculus 12/21/2012  . Metabolic syndrome 99991111  . OSA (obstructive sleep apnea) 08/22/2011  . Smoking 08/22/2011    Medications- reviewed and updated Current Outpatient Prescriptions  Medication Sig Dispense Refill  . Ascorbic Acid (VITAMIN C) 100 MG tablet Take 100 mg by mouth daily.      Marland Kitchen doxycycline (VIBRAMYCIN) 100 MG capsule Take 1 capsule (100 mg total) by mouth 2 (two) times daily. 20 capsule 0  . loratadine (CLARITIN) 10 MG tablet Take 10 mg by mouth daily.    . multivitamin-iron-minerals-folic acid (CENTRUM) chewable tablet Chew 1 tablet by mouth daily.      . naproxen sodium (ANAPROX) 220 MG tablet Take 220 mg by mouth 2 (two) times daily with a meal.    . sildenafil (VIAGRA) 100 MG tablet Take 1 tablet (100 mg total) by mouth as needed for erectile dysfunction. 6 tablet 11  . amLODipine (NORVASC) 5 MG tablet Take 1 tablet (5 mg total) by mouth daily. 30 tablet 3   No current facility-administered medications for this visit.    Objective: BP 150/84 mmHg  Pulse 115  Temp(Src) 97.9 F (36.6 C)  Wt 260 lb (117.935 kg)  SpO2 96% Gen: NAD, resting comfortably CV: sinus tachycardia but regular rate.  no murmurs rubs or gallops Lungs: CTAB no crackles, wheeze, rhonchi Abdomen: soft/nontender/nondistended/normal bowel sounds. No rebound or guarding.  Ext:  no edema Skin: warm, dry Neuro: CN II-XII intact, sensation and reflexes normal throughout, 5/5 muscle strength in bilateral upper and lower extremities. Normal finger to nose. Normal rapid alternating movements.   EKG sinus tachycardia with HR 110, normal axis, normal intervals, no hypertrophy. No st or t wave changes. Read as atrial flutter by EKG machine but there are obvious P waves before each QRS complex in 1:1 ratio.   Assessment/Plan:  Hypertension Headache/palpitations/fatigue/decreased focus S:Dull headache in front of head and down at base of his neck that he has not been able to shake for over a week. Mild to moderate aching. Does occasionally get headaches more severe than this but aleve clears them. Aleve and tylenol have not helped at present.   Patient wonders if he could be dehydrated and states his h20 intake has been low. No abnormal stress lately. He has found it Harder to focus at work for a few weeks. Constantly tired in this time frame.   Over last few days Has had some home pressures  >160 on several occasions. He had high blood pressure when seen for sinusitis a week ago. His sinus pressure improved with doxycycline course and he is still taking. He also notes 7esterday had some palpitations BP Readings from Last 3 Encounters:  07/11/15 150/84  07/05/15 136/90  03/04/15 130/90   A/P:  In regards to Headache, reassuring normal neurological exam. Patient with history of headaches and less severe in intensity than prior headaches but longer in  duration. Could be BP related but unclear. - will treat BP and reevaluate. Patient does have risk factors for CVA including hypertriglyceridemia and smoking but obvious CVA pattern on exam or history.   In regards to palpitations, see history of tachycardia and suspect sinus tachycardia. EKG showed sinus tachycardia today but patient not experiencing palpitations. Since only had 1 day of symptoms, follow up next week to see if  recurrence but would hold on holter monitor for now.   In regards to Fatigue/decreased : check basic labs. With obesity, I also wonder if sleep apnea could contribute- would follow up next visit to make sure patient is using CPAP  In regards to Essential hypertension 2 consecutive readings with high blood pressure diastolic not including today. When I repeated blood pressure today I got 154/90 as well. With 3 consecutive elevated readings- reasonable to diagnose with hypertension. Will start amlodipine 5mg . Not sure if this will clear headache and other symptoms but will follow closely.   Strict return precautions advised. Encouraged follow up within a week.   Orders Placed This Encounter  Procedures  . CBC with Differential/Platelet  . Comprehensive metabolic panel    [place]  . TSH    [place]  . EKG 12-Lead    Meds ordered this encounter  Medications  . amLODipine (NORVASC) 5 MG tablet    Sig: Take 1 tablet (5 mg total) by mouth daily.    Dispense:  30 tablet    Refill:  3

## 2015-07-11 NOTE — Patient Instructions (Addendum)
EKG was ok- you do have a high baseline heart rate though  Start amlodipine 5 mg for elevated blood pressure (3 consecutive visits that has been high)  Check labs for potential causes of heart racing and palpitations you felt yesterday.   Follow up with Dr. Elease Hashimoto at first available next week.  Seek care immediately if you have new or worsening symptoms

## 2015-07-11 NOTE — Telephone Encounter (Signed)
[facility] - Client [facility]  Patient Name: Gary Mora  DOB: Oct 07, 1965    Initial Comment Caller states his blood pressure has been running high, 168/110, having headache and blurry vision   Nurse Assessment  Nurse: Wynetta Emery, RN, Baker Janus Date/Time (Eastern Time): 07/11/2015 9:07:41 AM  Confirm and document reason for call. If symptomatic, describe symptoms. ---[name] is having high blood pressure not dx with HTN and 168/110 blurry vision and headache present  Has the patient traveled out of the country within the last 30 days? ---No  Does the patient have any new or worsening symptoms? ---Yes  Will a triage be completed? ---Yes  Related visit to physician within the last 2 weeks? ---No  Does the PT have any chronic conditions? (i.e. diabetes, asthma, etc.) ---No  Is this a behavioral health call? ---No     Guidelines    Guideline Title Affirmed Question Affirmed Notes  High Blood Pressure BP ? 160/100    Final Disposition User   See Physician within 4 Hours (or PCP triage) Wynetta Emery, RN, Baker Janus    Comments  APPT given for 07/11/2015 230pm with Garret Reddish, MD (none available with his MD) blood pressure elevation not on antihypertensive agents   Referrals  REFERRED TO PCP OFFICE   Disagree/Comply: Comply

## 2015-07-11 NOTE — Assessment & Plan Note (Signed)
2 consecutive readings with high blood pressure diastolic not including today. When I repeated blood pressure today I got 154/90 as well. With 3 consecutive elevated readings- reasonable to diagnose with hypertension. Will start amlodipine 5mg . Not sure if this will clear headache and other symptoms but will follow closely.

## 2015-07-11 NOTE — Telephone Encounter (Signed)
Noted  

## 2015-07-18 ENCOUNTER — Ambulatory Visit (INDEPENDENT_AMBULATORY_CARE_PROVIDER_SITE_OTHER): Payer: Managed Care, Other (non HMO) | Admitting: Family Medicine

## 2015-07-18 VITALS — BP 142/90 | HR 121 | Temp 98.2°F | Wt 267.9 lb

## 2015-07-18 DIAGNOSIS — I10 Essential (primary) hypertension: Secondary | ICD-10-CM | POA: Diagnosis not present

## 2015-07-18 NOTE — Patient Instructions (Signed)
DASH Eating Plan DASH stands for "Dietary Approaches to Stop Hypertension." The DASH eating plan is a healthy eating plan that has been shown to reduce high blood pressure (hypertension). Additional health benefits may include reducing the risk of type 2 diabetes mellitus, heart disease, and stroke. The DASH eating plan may also help with weight loss. WHAT DO I NEED TO KNOW ABOUT THE DASH EATING PLAN? For the DASH eating plan, you will follow these general guidelines:  Choose foods with a percent daily value for sodium of less than 5% (as listed on the food label).  Use salt-free seasonings or herbs instead of table salt or sea salt.  Check with your health care provider or pharmacist before using salt substitutes.  Eat lower-sodium products, often labeled as "lower sodium" or "no salt added."  Eat fresh foods.  Eat more vegetables, fruits, and low-fat dairy products.  Choose whole grains. Look for the word "whole" as the first word in the ingredient list.  Choose fish and skinless chicken or turkey more often than red meat. Limit fish, poultry, and meat to 6 oz (170 g) each day.  Limit sweets, desserts, sugars, and sugary drinks.  Choose heart-healthy fats.  Limit cheese to 1 oz (28 g) per day.  Eat more home-cooked food and less restaurant, buffet, and fast food.  Limit fried foods.  Cook foods using methods other than frying.  Limit canned vegetables. If you do use them, rinse them well to decrease the sodium.  When eating at a restaurant, ask that your food be prepared with less salt, or no salt if possible. WHAT FOODS CAN I EAT? Seek help from a dietitian for individual calorie needs. Grains Whole grain or whole wheat bread. Brown rice. Whole grain or whole wheat pasta. Quinoa, bulgur, and whole grain cereals. Low-sodium cereals. Corn or whole wheat flour tortillas. Whole grain cornbread. Whole grain crackers. Low-sodium crackers. Vegetables Fresh or frozen vegetables  (raw, steamed, roasted, or grilled). Low-sodium or reduced-sodium tomato and vegetable juices. Low-sodium or reduced-sodium tomato sauce and paste. Low-sodium or reduced-sodium canned vegetables.  Fruits All fresh, canned (in natural juice), or frozen fruits. Meat and Other Protein Products Ground beef (85% or leaner), grass-fed beef, or beef trimmed of fat. Skinless chicken or turkey. Ground chicken or turkey. Pork trimmed of fat. All fish and seafood. Eggs. Dried beans, peas, or lentils. Unsalted nuts and seeds. Unsalted canned beans. Dairy Low-fat dairy products, such as skim or 1% milk, 2% or reduced-fat cheeses, low-fat ricotta or cottage cheese, or plain low-fat yogurt. Low-sodium or reduced-sodium cheeses. Fats and Oils Tub margarines without trans fats. Light or reduced-fat mayonnaise and salad dressings (reduced sodium). Avocado. Safflower, olive, or canola oils. Natural peanut or almond butter. Other Unsalted popcorn and pretzels. The items listed above may not be a complete list of recommended foods or beverages. Contact your dietitian for more options. WHAT FOODS ARE NOT RECOMMENDED? Grains White bread. White pasta. White rice. Refined cornbread. Bagels and croissants. Crackers that contain trans fat. Vegetables Creamed or fried vegetables. Vegetables in a cheese sauce. Regular canned vegetables. Regular canned tomato sauce and paste. Regular tomato and vegetable juices. Fruits Dried fruits. Canned fruit in light or heavy syrup. Fruit juice. Meat and Other Protein Products Fatty cuts of meat. Ribs, chicken wings, bacon, sausage, bologna, salami, chitterlings, fatback, hot dogs, bratwurst, and packaged luncheon meats. Salted nuts and seeds. Canned beans with salt. Dairy Whole or 2% milk, cream, half-and-half, and cream cheese. Whole-fat or sweetened yogurt. Full-fat   cheeses or blue cheese. Nondairy creamers and whipped toppings. Processed cheese, cheese spreads, or cheese  curds. Condiments Onion and garlic salt, seasoned salt, table salt, and sea salt. Canned and packaged gravies. Worcestershire sauce. Tartar sauce. Barbecue sauce. Teriyaki sauce. Soy sauce, including reduced sodium. Steak sauce. Fish sauce. Oyster sauce. Cocktail sauce. Horseradish. Ketchup and mustard. Meat flavorings and tenderizers. Bouillon cubes. Hot sauce. Tabasco sauce. Marinades. Taco seasonings. Relishes. Fats and Oils Butter, stick margarine, lard, shortening, ghee, and bacon fat. Coconut, palm kernel, or palm oils. Regular salad dressings. Other Pickles and olives. Salted popcorn and pretzels. The items listed above may not be a complete list of foods and beverages to avoid. Contact your dietitian for more information. WHERE CAN I FIND MORE INFORMATION? National Heart, Lung, and Blood Institute: travelstabloid.com   This information is not intended to replace advice given to you by your health care provider. Make sure you discuss any questions you have with your health care provider.   Document Released: 07/25/2011 Document Revised: 08/26/2014 Document Reviewed: 06/09/2013 Elsevier Interactive Patient Education 2016 [place] some weight . Schedule complete physical.

## 2015-07-18 NOTE — Progress Notes (Signed)
Pre visit review using our clinic review tool, if applicable. No additional management support is needed unless otherwise documented below in the visit note. 

## 2015-07-18 NOTE — Progress Notes (Signed)
   Subjective:    Patient ID: Gary Mora, male    DOB: [birth year], 49 y.o.   MRN: [id number]  HPI Follow-up hypertension. Patient had 2 recent visits here for upper respiratory infection. Last visit his blood pressure was up and initiation of amlodipine 5 mgs daily. His headaches have gone away after starting this. He before starting amlodipine had one blood pressure reading 179/127 at home. He's not been monitoring regularly. He plans to start weight loss program and start regular exercise soon. No recent chest pains. No headaches currently. No peripheral edema. No alcohol consumption. No recent nonsteroidal use. Respiratory symptoms improved  Past Medical History  Diagnosis Date  . Allergy   . Chicken pox   . Substance abuse   . Chronic kidney disease     kidney stone  . Headache(784.0)     tension after using the computer  . Sleep apnea     Had surgery to correct no cpapa used   Past Surgical History  Procedure Laterality Date  . Tonsillectomy  2006  . Uvulopalatopharyngoplasty  2005    reports that he has quit smoking. His smoking use included Cigarettes. He has a 30 pack-year smoking history. He has never used smokeless tobacco. He reports that he does not drink alcohol or use illicit drugs. family history includes Arthritis in his father and mother; Hypothyroidism in his sister. Allergies  Allergen Reactions  . Asa Buff (Mag [Buffered Aspirin]     hives      Review of Systems  Constitutional: Negative for fatigue.  Eyes: Negative for visual disturbance.  Respiratory: Negative for cough, chest tightness and shortness of breath.   Cardiovascular: Negative for chest pain, palpitations and leg swelling.  Neurological: Negative for dizziness, syncope, weakness, light-headedness and headaches.       Objective:   Physical Exam  Constitutional: He is oriented to person, place, and time. He appears well-developed and well-nourished.  HENT:  Right Ear: External ear  normal.  Left Ear: External ear normal.  Mouth/Throat: Oropharynx is clear and moist.  Eyes: Pupils are equal, round, and reactive to light.  Neck: Neck supple. No thyromegaly present.  Cardiovascular: Normal rate and regular rhythm.   Pulmonary/Chest: Effort normal and breath sounds normal. No respiratory distress. He has no wheezes. He has no rales.  Musculoskeletal: He exhibits no edema.  Neurological: He is alert and oriented to person, place, and time.          Assessment & Plan:  Hypertension. Improved. Continue amlodipine. Strongly encourage weight loss. Reduce sodium. Schedule physical for next couple months and reassess blood pressure then.

## 2015-09-11 ENCOUNTER — Encounter: Payer: Self-pay | Admitting: Family Medicine

## 2015-09-11 ENCOUNTER — Telehealth: Payer: Self-pay | Admitting: Family Medicine

## 2015-09-11 ENCOUNTER — Ambulatory Visit (INDEPENDENT_AMBULATORY_CARE_PROVIDER_SITE_OTHER): Payer: Managed Care, Other (non HMO) | Admitting: Family Medicine

## 2015-09-11 VITALS — BP 120/100 | HR 119 | Temp 98.3°F | Ht 71.0 in | Wt 265.7 lb

## 2015-09-11 DIAGNOSIS — I10 Essential (primary) hypertension: Secondary | ICD-10-CM

## 2015-09-11 DIAGNOSIS — R0602 Shortness of breath: Secondary | ICD-10-CM | POA: Diagnosis not present

## 2015-09-11 DIAGNOSIS — R0989 Other specified symptoms and signs involving the circulatory and respiratory systems: Secondary | ICD-10-CM

## 2015-09-11 DIAGNOSIS — R5383 Other fatigue: Secondary | ICD-10-CM

## 2015-09-11 DIAGNOSIS — E872 Acidosis: Secondary | ICD-10-CM | POA: Diagnosis not present

## 2015-09-11 NOTE — Progress Notes (Signed)
Subjective:    Patient ID: Gary Mora, male    DOB: 02-Jul-1966, 50 y.o.   MRN: [id number]  HPI Patient called in earlier today apparently with complaints of shortness of breath. He had some nonspecific fatigue for several months. On further questioning, he does not have any dyspnea with activity or any orthopnea. He states he has difficulty getting a full deep breath. He thinks some of this may be anxiety related. He has not had any recent chest pains whatsoever. No cough. No fever. No pleuritic pain. No increased peripheral edema or leg pain. He realizes that he is deconditioned and is very sedentary but does not have dyspnea with basic things like walking on a level surface. He does have some dyspnea with things like climbing stairs but that is not new.  He was started back in November on amlodipine but has been inconsistent with taking this. He also had several labs ordered including TSH, comprehensive metabolic panel, and CBC but he apparently never went to get these drawn.  Past Medical History  Diagnosis Date  . Allergy   . Chicken pox   . Substance abuse   . Chronic kidney disease     kidney stone  . Headache(784.0)     tension after using the computer  . Sleep apnea     Had surgery to correct no cpapa used   Past Surgical History  Procedure Laterality Date  . Tonsillectomy  2006  . Uvulopalatopharyngoplasty  2005    reports that he has quit smoking. His smoking use included Cigarettes. He has a 30 pack-year smoking history. He has never used smokeless tobacco. He reports that he does not drink alcohol or use illicit drugs. family history includes Arthritis in his father and mother; Hypothyroidism in his sister. Allergies  Allergen Reactions  . Asa Buff (Mag [Buffered Aspirin]     hives      Review of Systems  Constitutional: Positive for fatigue.  HENT: Negative for trouble swallowing.   Eyes: Negative for visual disturbance.  Respiratory: Negative for cough and  chest tightness.   Cardiovascular: Negative for chest pain, palpitations and leg swelling.  Gastrointestinal: Negative for abdominal pain.  Genitourinary: Negative for dysuria.  Neurological: Negative for dizziness, syncope, weakness, light-headedness and headaches.       Objective:   Physical Exam  Constitutional: He is oriented to person, place, and time. He appears well-developed and well-nourished.  HENT:  Right Ear: External ear normal.  Left Ear: External ear normal.  Mouth/Throat: Oropharynx is clear and moist.  Eyes: Pupils are equal, round, and reactive to light.  Neck: Neck supple. No JVD present. No thyromegaly present.  Cardiovascular: Normal rate and regular rhythm.   Pulmonary/Chest: Effort normal and breath sounds normal. No respiratory distress. He has no wheezes. He has no rales.  Musculoskeletal: He exhibits no edema.  Neurological: He is alert and oriented to person, place, and time.          Assessment & Plan:  #1 air hunger. He has normal lung exam and pulse oximetry 98%. He has not had any change in terms of dyspnea with activity such as walking and is describing a difficulty getting a full breath and suspect that there may be of an anxiety component to that. He's not had any worrisome symptoms such as exertional chest pains. He does have history of obstructive sleep apnea and this needs to be further addressed at his physical.  EKG reveals mild sinus tachycardia. No acute  findings.  #2 hypertension. Suboptimal control. Inconsistent use of amlodipine. He is encouraged take this regularly and lose some weight. Reassess at physical  #3 Fatigue. May be largely related to obstructive sleep apnea. Needs screening lab work including TSH and other chemistries and he will be getting this in one week as he is scheduled for physical soon

## 2015-09-11 NOTE — Telephone Encounter (Signed)
Spoke with patient. He states that he is having trouble getting a full breath, and at times he feels like he is out of breath.  Patient states he spoke with [facility] and denied ED, and he just wants to see Dr. Elease Hashimoto - appt scheduled today at 4:15.

## 2015-09-11 NOTE — Progress Notes (Signed)
Pre visit review using our clinic review tool, if applicable. No additional management support is needed unless otherwise documented below in the visit note. 

## 2015-09-11 NOTE — Patient Instructions (Signed)

## 2015-09-11 NOTE — Telephone Encounter (Signed)
Patient Name: Gary Mora DOB: April 21, 1966 Initial Comment Caller states he is having shortness of breath. Nurse Assessment Nurse: Ronnald Ramp, RN, Miranda Date/Time (Eastern Time): 09/11/2015 10:11:31 AM Confirm and document reason for call. If symptomatic, describe symptoms. You must click the next button to save text entered. ---Caller states he has felt like it was hard to take in a full breath since last week. This weekend felt tired. Has the patient traveled out of the country within the last 30 days? ---Not Applicable Does the patient have any new or worsening symptoms? ---Yes Will a triage be completed? ---Yes Related visit to physician within the last 2 weeks? ---No Does the PT have any chronic conditions? (i.e. diabetes, asthma, etc.) ---Yes List chronic conditions. ---HTN Is this a behavioral health or substance abuse call? ---No Guidelines Guideline Title Affirmed Question Affirmed Notes Breathing Difficulty [1] MODERATE difficulty breathing (e.g., speaks in phrases, SOB even at rest, pulse 100-120) AND [2] NEW-onset or WORSE than normal Final Disposition User Go to ED Now Ronnald Ramp, RN, Miranda Referrals GO TO FACILITY REFUSED Disagree/Comply: Comply

## 2015-09-21 ENCOUNTER — Other Ambulatory Visit (INDEPENDENT_AMBULATORY_CARE_PROVIDER_SITE_OTHER): Payer: Managed Care, Other (non HMO)

## 2015-09-21 DIAGNOSIS — Z0000 Encounter for general adult medical examination without abnormal findings: Secondary | ICD-10-CM

## 2015-09-21 DIAGNOSIS — R7989 Other specified abnormal findings of blood chemistry: Secondary | ICD-10-CM | POA: Diagnosis not present

## 2015-09-21 LAB — BASIC METABOLIC PANEL
BUN: 13 mg/dL (ref 6–23)
CHLORIDE: 102 meq/L (ref 96–112)
CO2: 30 mEq/L (ref 19–32)
CREATININE: 0.98 mg/dL (ref 0.40–1.50)
Calcium: 9.6 mg/dL (ref 8.4–10.5)
GFR: 86.02 mL/min (ref >60.00)
Glucose, Bld: 104 mg/dL — ABNORMAL HIGH (ref 70–99)
Potassium: 4.5 mEq/L (ref 3.5–5.1)
Sodium: 141 mEq/L (ref 135–145)

## 2015-09-21 LAB — CBC WITH DIFFERENTIAL/PLATELET
BASOS PCT: 0.6 % (ref 0.0–3.0)
Basophils Absolute: 0.1 10*3/uL (ref 0.0–0.1)
EOS ABS: 0.5 10*3/uL (ref 0.0–0.7)
Eosinophils Relative: 4.9 % (ref 0.0–5.0)
HCT: 50.5 % (ref 39.0–52.0)
HEMOGLOBIN: 16.7 g/dL (ref 13.0–17.0)
Lymphocytes Relative: 30.3 % (ref 12.0–46.0)
Lymphs Abs: 3 10*3/uL (ref 0.7–4.0)
MCHC: 33 g/dL (ref 30.0–36.0)
MCV: 90.4 fl (ref 78.0–100.0)
MONO ABS: 0.6 10*3/uL (ref 0.1–1.0)
Monocytes Relative: 6.2 % (ref 3.0–12.0)
NEUTROS PCT: 58 % (ref 43.0–77.0)
Neutro Abs: 5.8 10*3/uL (ref 1.4–7.7)
Platelets: 429 10*3/uL — ABNORMAL HIGH (ref 150.0–400.0)
RBC: 5.58 Mil/uL (ref 4.22–5.81)
RDW: 13.6 % (ref 11.5–15.5)
WBC: 10 10*3/uL (ref 4.0–10.5)

## 2015-09-21 LAB — LIPID PANEL
CHOL/HDL RATIO: 7
Cholesterol: 183 mg/dL (ref 0–200)
HDL: 28.1 mg/dL — AB (ref >39.00)
NONHDL: 154.9
TRIGLYCERIDES: 274 mg/dL — AB (ref 0.0–149.0)
VLDL: 54.8 mg/dL — AB (ref 0.0–40.0)

## 2015-09-21 LAB — HEPATIC FUNCTION PANEL
ALT: 24 U/L (ref 0–53)
AST: 22 U/L (ref 0–37)
Albumin: 4.2 g/dL (ref 3.5–5.2)
Alkaline Phosphatase: 59 U/L (ref 39–117)
BILIRUBIN DIRECT: 0.1 mg/dL (ref 0.0–0.3)
BILIRUBIN TOTAL: 0.7 mg/dL (ref 0.2–1.2)
Total Protein: 7.8 g/dL (ref 6.0–8.3)

## 2015-09-21 LAB — TSH: TSH: 0.7 u[IU]/mL (ref 0.35–4.50)

## 2015-09-21 LAB — LDL CHOLESTEROL, DIRECT: LDL DIRECT: 126 mg/dL

## 2015-09-21 LAB — PSA: PSA: 0.77 ng/mL (ref 0.10–4.00)

## 2015-09-26 ENCOUNTER — Encounter: Payer: Self-pay | Admitting: Family Medicine

## 2015-09-26 ENCOUNTER — Ambulatory Visit (INDEPENDENT_AMBULATORY_CARE_PROVIDER_SITE_OTHER): Payer: Managed Care, Other (non HMO) | Admitting: Family Medicine

## 2015-09-26 VITALS — BP 130/90 | HR 104 | Temp 98.3°F | Ht 71.0 in | Wt 263.0 lb

## 2015-09-26 DIAGNOSIS — R7303 Prediabetes: Secondary | ICD-10-CM | POA: Diagnosis not present

## 2015-09-26 DIAGNOSIS — E1140 Type 2 diabetes mellitus with diabetic neuropathy, unspecified: Secondary | ICD-10-CM | POA: Insufficient documentation

## 2015-09-26 DIAGNOSIS — L309 Dermatitis, unspecified: Secondary | ICD-10-CM

## 2015-09-26 DIAGNOSIS — Z0001 Encounter for general adult medical examination with abnormal findings: Secondary | ICD-10-CM

## 2015-09-26 DIAGNOSIS — Z23 Encounter for immunization: Secondary | ICD-10-CM

## 2015-09-26 DIAGNOSIS — E119 Type 2 diabetes mellitus without complications: Secondary | ICD-10-CM | POA: Insufficient documentation

## 2015-09-26 DIAGNOSIS — Z0000 Encounter for general adult medical examination without abnormal findings: Secondary | ICD-10-CM

## 2015-09-26 MED ORDER — DESOXIMETASONE 0.25 % EX CREA: 1.0000 "application " | TOPICAL_CREAM | Freq: Two times a day (BID) | CUTANEOUS | Status: AC | PRN

## 2015-09-26 NOTE — Progress Notes (Signed)
Subjective:    Patient ID: Gary Mora, male    DOB: Oct 23, 1965, 50 y.o.   MRN: [id number]  HPI Patient here for physical exam- and separate issue of recurrent foot rash as below.  Medical problems include history of obesity,  Hypertension, dyslipidemia Poor compliance with diet and exercise. Plans to start back supervised exercise program soon. Still smokes about half packs cigarettes per day. Takes amlodipine for hypertension. No recent headaches or chest pain.  Just turned 50. No history of screening colonoscopy.  Recurrent pruritic scaly rash on both feet. Has seen dermatologist previously and had fungal cultures reportedly normal. Apparently did not respond to antifungal cream. Has intermittent episodes associated with itching.  Has tried change of socks without improvement.  No hx of interdigital involvement.  Past Medical History  Diagnosis Date  . Allergy   . Chicken pox   . Substance abuse   . Chronic kidney disease     kidney stone  . Headache(784.0)     tension after using the computer  . Sleep apnea     Had surgery to correct no cpapa used   Past Surgical History  Procedure Laterality Date  . Tonsillectomy  2006  . Uvulopalatopharyngoplasty  2005    reports that he has quit smoking. His smoking use included Cigarettes. He has a 30 pack-year smoking history. He has never used smokeless tobacco. He reports that he does not drink alcohol or use illicit drugs. family history includes Arthritis in his father and mother; Hypothyroidism in his sister. Allergies  Allergen Reactions  . Asa Buff (Mag [Buffered Aspirin]     hives      Review of Systems  Constitutional: Negative for fever, activity change, appetite change and fatigue.  HENT: Negative for congestion, ear pain and trouble swallowing.   Eyes: Negative for pain and visual disturbance.  Respiratory: Negative for cough, shortness of breath and wheezing.   Cardiovascular: Negative for chest pain and  palpitations.  Gastrointestinal: Negative for nausea, vomiting, abdominal pain, diarrhea, constipation, blood in stool, abdominal distention and rectal pain.  Endocrine: Negative for polydipsia and polyuria.  Genitourinary: Negative for dysuria, hematuria and testicular pain.  Musculoskeletal: Negative for joint swelling and arthralgias.  Skin: Negative for rash.  Neurological: Negative for dizziness, syncope and headaches.  Hematological: Negative for adenopathy.  Psychiatric/Behavioral: Negative for confusion and dysphoric mood.       Objective:   Physical Exam  Constitutional: He is oriented to person, place, and time. He appears well-developed and well-nourished. No distress.  HENT:  Head: Normocephalic and atraumatic.  Right Ear: External ear normal.  Left Ear: External ear normal.  Mouth/Throat: Oropharynx is clear and moist.  Eyes: Conjunctivae and EOM are normal. Pupils are equal, round, and reactive to light.  Neck: Normal range of motion. Neck supple. No thyromegaly present.  Cardiovascular: Normal rate, regular rhythm and normal heart sounds.   No murmur heard. Pulmonary/Chest: No respiratory distress. He has no wheezes. He has no rales.  Abdominal: Soft. Bowel sounds are normal. He exhibits no distension and no mass. There is no tenderness. There is no rebound and no guarding.  Musculoskeletal: He exhibits no edema.  Lymphadenopathy:    He has no cervical adenopathy.  Neurological: He is alert and oriented to person, place, and time. He displays normal reflexes. No cranial nerve deficit.  Skin: Rash noted.  Left foot on the medial mid aspect he has approximately 1 cm nummular area of eczema  Psychiatric: He has a  normal mood and affect.          Assessment & Plan:  Physical exam. Labs reviewed. Major issues include obesity, ongoing nicotine use, dyslipidemia, prediabetes with fasting blood sugar 104. Still needs flu vaccine.  Recommend:  -Flu  vaccination -Schedule screening colonoscopy -Lose some weight and he plans start supervised programs soon -We have encouraged to quit smoking -follow up in about 4-6 months and repeat fasting lipids and A1C.  Recurrent pruritic rash on feet. Question dyshidrotic eczema. Topicort 0.25% cream twice daily as needed for flareups- no more than 2 weeks continuously. Reportedly, fungal cultures have been negative as above

## 2015-09-26 NOTE — Progress Notes (Signed)
Pre visit review using our clinic review tool, if applicable. No additional management support is needed unless otherwise documented below in the visit note. 

## 2015-09-26 NOTE — Patient Instructions (Signed)
Smoking Cessation, Tips for Success If you are ready to quit smoking, congratulations! You have chosen to help yourself be healthier. Cigarettes bring nicotine, tar, carbon monoxide, and other irritants into your body. Your lungs, heart, and blood vessels will be able to work better without these poisons. There are many different ways to quit smoking. Nicotine gum, nicotine patches, a nicotine inhaler, or nicotine nasal spray can help with physical craving. Hypnosis, support groups, and medicines help break the habit of smoking. WHAT THINGS CAN I DO TO MAKE QUITTING EASIER?  Here are some tips to help you quit for good:  Pick a date when you will quit smoking completely. Tell all of your friends and family about your plan to quit on that date.  Do not try to slowly cut down on the number of cigarettes you are smoking. Pick a quit date and quit smoking completely starting on that day.  Throw away all cigarettes.   Clean and remove all ashtrays from your home, work, and car.  On a card, write down your reasons for quitting. Carry the card with you and read it when you get the urge to smoke.  Cleanse your body of nicotine. Drink enough water and fluids to keep your urine clear or pale yellow. Do this after quitting to flush the nicotine from your body.  Learn to predict your moods. Do not let a bad situation be your excuse to have a cigarette. Some situations in your life might tempt you into wanting a cigarette.  Never have "just one" cigarette. It leads to wanting another and another. Remind yourself of your decision to quit.  Change habits associated with smoking. If you smoked while driving or when feeling stressed, try other activities to replace smoking. Stand up when drinking your coffee. Brush your teeth after eating. Sit in a different chair when you read the paper. Avoid alcohol while trying to quit, and try to drink fewer caffeinated beverages. Alcohol and caffeine may urge you to  smoke.  Avoid foods and drinks that can trigger a desire to smoke, such as sugary or spicy foods and alcohol.  Ask people who smoke not to smoke around you.  Have something planned to do right after eating or having a cup of coffee. For example, plan to take a walk or exercise.  Try a relaxation exercise to calm you down and decrease your stress. Remember, you may be tense and nervous for the first 2 weeks after you quit, but this will pass.  Find new activities to keep your hands busy. Play with a pen, coin, or rubber band. Doodle or draw things on paper.  Brush your teeth right after eating. This will help cut down on the craving for the taste of tobacco after meals. You can also try mouthwash.   Use oral substitutes in place of cigarettes. Try using lemon drops, carrots, cinnamon sticks, or chewing gum. Keep them handy so they are available when you have the urge to smoke.  When you have the urge to smoke, try deep breathing.  Designate your home as a nonsmoking area.  If you are a heavy smoker, ask your health care provider about a prescription for nicotine chewing gum. It can ease your withdrawal from nicotine.  Reward yourself. Set aside the cigarette money you save and buy yourself something nice.  Look for support from others. Join a support group or smoking cessation program. Ask someone at home or at work to help you with your plan   to quit smoking.  Always ask yourself, "Do I need this cigarette or is this just a reflex?" Tell yourself, "Today, I choose not to smoke," or "I do not want to smoke." You are reminding yourself of your decision to quit.  Do not replace cigarette smoking with electronic cigarettes (commonly called e-cigarettes). The safety of e-cigarettes is unknown, and some may contain harmful chemicals.  If you relapse, do not give up! Plan ahead and think about what you will do the next time you get the urge to smoke. HOW WILL I FEEL WHEN I QUIT SMOKING? You  may have symptoms of withdrawal because your body is used to nicotine (the addictive substance in cigarettes). You may crave cigarettes, be irritable, feel very hungry, cough often, get headaches, or have difficulty concentrating. The withdrawal symptoms are only temporary. They are strongest when you first quit but will go away within 10-14 days. When withdrawal symptoms occur, stay in control. Think about your reasons for quitting. Remind yourself that these are signs that your body is healing and getting used to being without cigarettes. Remember that withdrawal symptoms are easier to treat than the major diseases that smoking can cause.  Even after the withdrawal is over, expect periodic urges to smoke. However, these cravings are generally short lived and will go away whether you smoke or not. Do not smoke! WHAT RESOURCES ARE AVAILABLE TO HELP ME QUIT SMOKING? Your health care provider can direct you to community resources or hospitals for support, which may include:  Group support.  Education.  Hypnosis.  Therapy.   This information is not intended to replace advice given to you by your health care provider. Make sure you discuss any questions you have with your health care provider.   Document Released: 05/03/2004 Document Revised: 08/26/2014 Document Reviewed: 01/21/2013 Elsevier Interactive Patient Education 2016 [employer]  

## 2015-10-07 ENCOUNTER — Other Ambulatory Visit: Payer: Self-pay | Admitting: Family Medicine

## 2016-05-13 ENCOUNTER — Ambulatory Visit (INDEPENDENT_AMBULATORY_CARE_PROVIDER_SITE_OTHER): Payer: Managed Care, Other (non HMO) | Admitting: Family Medicine

## 2016-05-13 ENCOUNTER — Encounter: Payer: Self-pay | Admitting: Family Medicine

## 2016-05-13 VITALS — BP 128/88 | HR 124 | Temp 98.1°F | Ht 71.0 in | Wt 264.0 lb

## 2016-05-13 DIAGNOSIS — H9202 Otalgia, left ear: Secondary | ICD-10-CM

## 2016-05-13 DIAGNOSIS — K921 Melena: Secondary | ICD-10-CM | POA: Diagnosis not present

## 2016-05-13 DIAGNOSIS — K5900 Constipation, unspecified: Secondary | ICD-10-CM | POA: Diagnosis not present

## 2016-05-13 NOTE — Progress Notes (Signed)
Pre visit review using our clinic review tool, if applicable. No additional management support is needed unless otherwise documented below in the visit note. 

## 2016-05-13 NOTE — Patient Instructions (Signed)

## 2016-05-13 NOTE — Progress Notes (Signed)
Subjective:     Patient ID: Gary Mora, male   DOB: [birth year], 50 y.o.   MRN: [id number]  HPI Patient seen for the following issues  He's had some left ear pain and left throat pain for the past week. Gradually worsening. No definite fever. Some postnasal drip symptoms. No difficulty swallowing. No adenopathy. No recent appetite or weight changes  Second issue is patient has over the past several weeks noted some intermittent bright red blood per rectum. He has frequent constipation symptoms.. Does not drink a lot of fluids. He feels he is getting adequate fiber. No pain with bowel movements. Turned 50 early this year. No history of screening colonoscopy.  Past Medical History:  Diagnosis Date  . Allergy   . Chicken pox   . Chronic kidney disease    kidney stone  . Headache(784.0)    tension after using the computer  . Sleep apnea    Had surgery to correct no cpapa used  . Substance abuse    Past Surgical History:  Procedure Laterality Date  . TONSILLECTOMY  2006  . UVULOPALATOPHARYNGOPLASTY  2005    reports that he has quit smoking. His smoking use included Cigarettes. He has a 30.00 pack-year smoking history. He has never used smokeless tobacco. He reports that he does not drink alcohol or use drugs. family history includes Arthritis in his father and mother; Hypothyroidism in his sister. Allergies  Allergen Reactions  . Asa Buff (Mag [Buffered Aspirin]     hives     Review of Systems  Constitutional: Negative for appetite change, chills, fever and unexpected weight change.  HENT: Positive for ear pain, postnasal drip and sore throat.   Respiratory: Negative for shortness of breath.   Cardiovascular: Negative for chest pain.  Gastrointestinal: Positive for blood in stool and constipation. Negative for abdominal pain, diarrhea, nausea, rectal pain and vomiting.  Neurological: Negative for headaches.  Hematological: Negative for adenopathy.       Objective:   Physical Exam  Constitutional: He appears well-developed and well-nourished.  HENT:  Right Ear: External ear normal.  Left Ear: External ear normal.  He's had previous uvulopalatopharyngoplasty.  No erythema. No exudate  Neck: Neck supple.  Cardiovascular: Normal rate and regular rhythm.   Pulmonary/Chest: Effort normal and breath sounds normal. No respiratory distress. He has no wheezes. He has no rales.  Abdominal: Soft. He exhibits no mass. There is no tenderness. There is no rebound and no guarding.  Genitourinary:  Genitourinary Comments: No obvious anal fissure. Does not have any inflamed hemorrhoids. Couple of minimal skin tags. Digital exam no rectal mass.  Musculoskeletal: He exhibits no edema.  Lymphadenopathy:    He has no cervical adenopathy.       Assessment:     #1 left otalgia with normal exam. Oropharyngeal exam likewise is normal. Does describe some postnasal drip symptoms which may be allergy related  #2 intermittent bright red blood per rectum. Patient has never had screening colonoscopy  #3 constipation    Plan:     -Set up GI referral for colonoscopy -Discussed measures to reduce constipation with increasing fiber to 25-30 g daily and increased fluid intake -Consider as needed MiraLAX if not relieved with the above -He'll try something for postnasal drip symptoms such as Claritin or Allegra. Touch base in 2 weeks if throat symptoms not improving  Eulas Post MD [facility] at [facility]

## 2016-06-18 ENCOUNTER — Encounter: Payer: Self-pay | Admitting: Family Medicine

## 2016-06-18 ENCOUNTER — Ambulatory Visit (INDEPENDENT_AMBULATORY_CARE_PROVIDER_SITE_OTHER): Payer: Managed Care, Other (non HMO) | Admitting: Family Medicine

## 2016-06-18 VITALS — BP 144/92 | HR 90 | Temp 97.8°F | Ht 71.0 in | Wt 265.1 lb

## 2016-06-18 DIAGNOSIS — N529 Male erectile dysfunction, unspecified: Secondary | ICD-10-CM

## 2016-06-18 DIAGNOSIS — J069 Acute upper respiratory infection, unspecified: Secondary | ICD-10-CM

## 2016-06-18 DIAGNOSIS — I10 Essential (primary) hypertension: Secondary | ICD-10-CM

## 2016-06-18 LAB — POCT INFLUENZA A/B: Influenza A, POC: NEGATIVE

## 2016-06-18 MED ORDER — SILDENAFIL CITRATE 20 MG PO TABS: ORAL_TABLET | ORAL | 0 refills | Status: DC

## 2016-06-18 NOTE — Progress Notes (Signed)
HPI:   URI: -started yesterday -symptoms include: tickle in throat, nasal congestion, cough, subjective fever yesterday -no body aches, sob, wheezing, flu or strep exposure, chronic long disease or   ED: -wants generic viagra -regular prescribed by PCP -uses rarely, aware of sided effects  HTN: -elevate don arrival, did not take meds yesterday or today -no cp, ha, vision changes, sob -reports usually takes meds daily, just missed  ROS: See pertinent positives and negatives per HPI.  Past Medical History:  Diagnosis Date  . Allergy   . Chicken pox   . Chronic kidney disease    kidney stone  . Headache(784.0)    tension after using the computer  . Sleep apnea    Had surgery to correct no cpapa used  . Substance abuse     Past Surgical History:  Procedure Laterality Date  . TONSILLECTOMY  2006  . UVULOPALATOPHARYNGOPLASTY  2005    Family History  Problem Relation Age of Onset  . Arthritis Mother   . Arthritis Father   . Hypothyroidism Sister     Social History   Social History  . Marital status: Divorced    Spouse name: N/A  . Number of children: N/A  . Years of education: N/A   Social History Main Topics  . Smoking status: Former Smoker    Packs/day: 1.00    Years: 30.00    Types: Cigarettes  . Smokeless tobacco: Never Used  . Alcohol use No  . Drug use: No  . Sexual activity: Not Asked   Other Topics Concern  . None   Social History Narrative  . None     Current Outpatient Prescriptions:  .  amLODipine (NORVASC) 5 MG tablet, TAKE 1 TABLET EVERY DAY, Disp: 90 tablet, Rfl: 2 .  Ascorbic Acid (VITAMIN C) 100 MG tablet, Take 100 mg by mouth daily.  , Disp: , Rfl:  .  desoximetasone (TOPICORT) 0.25 % cream, Apply 1 application topically 2 (two) times daily as needed., Disp: 30 g, Rfl: 2 .  loratadine (CLARITIN) 10 MG tablet, Take 10 mg by mouth daily., Disp: , Rfl:  .  multivitamin-iron-minerals-folic acid (CENTRUM) chewable tablet, Chew 1  tablet by mouth daily.  , Disp: , Rfl:  .  naproxen sodium (ANAPROX) 220 MG tablet, Take 220 mg by mouth 2 (two) times daily with a meal., Disp: , Rfl:  .  sildenafil (VIAGRA) 100 MG tablet, Take 1 tablet (100 mg total) by mouth as needed for erectile dysfunction., Disp: 6 tablet, Rfl: 11 .  sildenafil (REVATIO) 20 MG tablet, 2-5 tablets prior to intercourse. Do not repeat in 24 hours., Disp: 50 tablet, Rfl: 0  EXAM:  Vitals:   06/18/16 1355  BP: (!) 144/92  Pulse: 90  Temp: 97.8 F (36.6 C)    Body mass index is 36.97 kg/m.  GENERAL: vitals reviewed and listed above, alert, oriented, appears well hydrated and in no acute distress  HEENT: atraumatic, conjunttiva clear, no obvious abnormalities on inspection of external nose and ears  NECK: no obvious masses on inspection  LUNGS: clear to auscultation bilaterally, no wheezes, rales or rhonchi, good air movement  CV: HRRR, no peripheral edema  MS: moves all extremities without noticeable abnormality  PSYCH: pleasant and cooperative, no obvious depression or anxiety  ASSESSMENT AND PLAN:  Discussed the following assessment and plan:  Acute upper respiratory infection - Plan: POC Influenza A/B -flu test neg -no fever here, appear well, symptoms exam suggest VURI -symptomatic care, potential complications,  return precautions discussed  Erectile dysfunction, unspecified erectile dysfunction type -discussed risks viagra, that generic is not technically approved for this use, but that many men find it equivalent -rx provided per his wishes, advised further refills come from PCP  Essential hypertension -advised daily medication adherence, he agrees and assures me he will and usually does, agrees to monitor and follow up with PCP.  -Patient advised to return or notify a doctor immediately if symptoms worsen or persist or new concerns arise.  Patient Instructions  INSTRUCTIONS FOR UPPER RESPIRATORY INFECTION:  -plenty of  rest and fluids  -nasal saline wash 2-3 times daily (use prepackaged nasal saline or bottled/distilled water if making your own)   -can use AFRIN nasal spray for drainage and nasal congestion - but do NOT use longer then 3-4 days  -can use tylenol (in no history of liver disease) or ibuprofen (if no history of kidney disease, bowel bleeding or significant heart disease) as directed for aches and sorethroat  -in the winter time, using a humidifier at night is helpful (please follow cleaning instructions)  -if you are taking a cough medication - use only as directed, may also try a teaspoon of honey to coat the throat and throat lozenges.   -for sore throat, salt water gargles can help  -follow up if you have fevers, facial pain, tooth pain, difficulty breathing or are worsening or symptoms persist longer then expected  Upper Respiratory Infection, Adult An upper respiratory infection (URI) is also known as the common cold. It is often caused by a type of germ (virus). Colds are easily spread (contagious). You can pass it to others by kissing, coughing, sneezing, or drinking out of the same glass. Usually, you get better in 1 to 3  weeks.  However, the cough can last for even longer. HOME CARE   Only take medicine as told by your doctor. Follow instructions provided above.  Drink enough water and fluids to keep your pee (urine) clear or pale yellow.  Get plenty of rest.  Return to work when your temperature is < 100 for 24 hours or as told by your doctor. You may use a face mask and wash your hands to stop your cold from spreading. GET HELP RIGHT AWAY IF:   After the first few days, you feel you are getting worse.  You have questions about your medicine.  You have chills, shortness of breath, or red spit (mucus).  You have pain in the face for more then 1-2 days, especially when you bend forward.  You have a fever, puffy (swollen) neck, pain when you swallow, or white spots in the  back of your throat.  You have a bad headache, ear pain, sinus pain, or chest pain.  You have a high-pitched whistling sound when you breathe in and out (wheezing).  You cough up blood.  You have sore muscles or a stiff neck. MAKE SURE YOU:   Understand these instructions.  Will watch your condition.  Will get help right away if you are not doing well or get worse. Document Released: 01/22/2008 Document Revised: 10/28/2011 Document Reviewed: 11/10/2013 [facility] Patient Information 2015 [place], [place]. This information is not intended to replace advice given to you by your health care provider. Make sure you discuss any questions you have with your health care provider.    Colin Benton R., DO

## 2016-06-18 NOTE — Progress Notes (Signed)
Pre visit review using our clinic review tool, if applicable. No additional management support is needed unless otherwise documented below in the visit note. 

## 2016-06-18 NOTE — Patient Instructions (Signed)
INSTRUCTIONS FOR UPPER RESPIRATORY INFECTION:  -plenty of rest and fluids  -nasal saline wash 2-3 times daily (use prepackaged nasal saline or bottled/distilled water if making your own)   -can use AFRIN nasal spray for drainage and nasal congestion - but do NOT use longer then 3-4 days  -can use tylenol (in no history of liver disease) or ibuprofen (if no history of kidney disease, bowel bleeding or significant heart disease) as directed for aches and sorethroat  -in the winter time, using a humidifier at night is helpful (please follow cleaning instructions)  -if you are taking a cough medication - use only as directed, may also try a teaspoon of honey to coat the throat and throat lozenges.  -for sore throat, salt water gargles can help  -follow up if you have fevers, facial pain, tooth pain, difficulty breathing or are worsening or symptoms persist longer then expected  Upper Respiratory Infection, Adult An upper respiratory infection (URI) is also known as the common cold. It is often caused by a type of germ (virus). Colds are easily spread (contagious). You can pass it to others by kissing, coughing, sneezing, or drinking out of the same glass. Usually, you get better in 1 to 3  weeks.  However, the cough can last for even longer. HOME CARE   Only take medicine as told by your doctor. Follow instructions provided above.  Drink enough water and fluids to keep your pee (urine) clear or pale yellow.  Get plenty of rest.  Return to work when your temperature is < 100 for 24 hours or as told by your doctor. You may use a face mask and wash your hands to stop your cold from spreading. GET HELP RIGHT AWAY IF:   After the first few days, you feel you are getting worse.  You have questions about your medicine.  You have chills, shortness of breath, or red spit (mucus).  You have pain in the face for more then 1-2 days, especially when you bend forward.  You have a fever, puffy  (swollen) neck, pain when you swallow, or white spots in the back of your throat.  You have a bad headache, ear pain, sinus pain, or chest pain.  You have a high-pitched whistling sound when you breathe in and out (wheezing).  You cough up blood.  You have sore muscles or a stiff neck. MAKE SURE YOU:   Understand these instructions.  Will watch your condition.  Will get help right away if you are not doing well or get worse. Document Released: 01/22/2008 Document Revised: 10/28/2011 Document Reviewed: 11/10/2013 [employer] Patient Information 2015 [employer]. This information is not intended to replace advice given to you by your health care provider. Make sure you discuss any questions you have with your health care provider.  

## 2016-07-16 ENCOUNTER — Encounter: Payer: Self-pay | Admitting: Family Medicine

## 2016-07-20 ENCOUNTER — Other Ambulatory Visit: Payer: Self-pay | Admitting: Family Medicine

## 2016-08-21 ENCOUNTER — Ambulatory Visit (INDEPENDENT_AMBULATORY_CARE_PROVIDER_SITE_OTHER): Payer: Managed Care, Other (non HMO) | Admitting: Family Medicine

## 2016-08-21 ENCOUNTER — Encounter: Payer: Self-pay | Admitting: Family Medicine

## 2016-08-21 VITALS — BP 142/100 | HR 113 | Temp 98.0°F | Wt 272.3 lb

## 2016-08-21 DIAGNOSIS — R1011 Right upper quadrant pain: Secondary | ICD-10-CM | POA: Diagnosis not present

## 2016-08-21 NOTE — Patient Instructions (Signed)
Bland diet for now Follow up for any fever or increasing abdominal pain. We will call you with lab results.

## 2016-08-21 NOTE — Progress Notes (Signed)
Subjective:     Patient ID: Gary Mora, male   DOB: 10-13-1965, 51 y.o.   MRN: [id number]  HPI   Patient seen with 4 day history of some very mild right upper quadrant pain. Worsened a day ago. He describes achy pain which is somewhat constant but seems to wax and wane in severity. Symptoms were worse last night. He has not noted any correlation with eating. He's had slightly diminished appetite but no weight loss. No fevers or chills. No cough. He has also noted the pain seems to be slightly worse with movement. Denies any injury. Had ultrasound 2013 which showed no gallbladder wall thickening and no stones. No pleuritic pain. No skin rash.  Past Medical History:  Diagnosis Date  . Allergy   . Chicken pox   . Chronic kidney disease    kidney stone  . Headache(784.0)    tension after using the computer  . Sleep apnea    Had surgery to correct no cpapa used  . Substance abuse    Past Surgical History:  Procedure Laterality Date  . TONSILLECTOMY  2006  . UVULOPALATOPHARYNGOPLASTY  2005    reports that he has been smoking Cigarettes.  He has a 30.00 pack-year smoking history. He has never used smokeless tobacco. He reports that he does not drink alcohol or use drugs. family history includes Arthritis in his father and mother; Hypothyroidism in his sister. Allergies  Allergen Reactions  . Asa Buff (Mag [Buffered Aspirin]     hives     Review of Systems  Constitutional: Positive for appetite change. Negative for chills and fever.  Respiratory: Negative for shortness of breath.   Cardiovascular: Negative for chest pain.  Gastrointestinal: Positive for abdominal pain. Negative for blood in stool, constipation, diarrhea, nausea and vomiting.  Genitourinary: Negative for dysuria.       Objective:   Physical Exam  Constitutional: He appears well-developed and well-nourished.  Cardiovascular: Normal rate and regular rhythm.   Pulmonary/Chest: Effort normal and breath sounds  normal. No respiratory distress. He has no wheezes. He has no rales.  Abdominal: Soft. Bowel sounds are normal. He exhibits no mass. There is tenderness. There is no rebound and no guarding.  Musculoskeletal: He exhibits no edema.  Skin: No rash noted.       Assessment:     Abdominal pain right upper quadrant. No history of gallstones by previous ultrasound few years ago. No evidence for acute abdomen.    Plan:     -Check labs with CBC, basic metabolic panel, hepatic panel, serum lipase. -Bland diet for the next few days -Follow-up immediately for fever,worsening pain, or new symptoms -Consider abdominal ultrasound if symptoms persist or worsen  Eulas Post MD [facility]

## 2016-08-22 ENCOUNTER — Telehealth: Payer: Self-pay | Admitting: Family Medicine

## 2016-08-22 LAB — CBC WITH DIFFERENTIAL/PLATELET
BASOS PCT: 0.6 % (ref 0.0–3.0)
Basophils Absolute: 0.1 10*3/uL (ref 0.0–0.1)
EOS PCT: 3.1 % (ref 0.0–5.0)
Eosinophils Absolute: 0.4 10*3/uL (ref 0.0–0.7)
HEMATOCRIT: 47.4 % (ref 39.0–52.0)
HEMOGLOBIN: 16.2 g/dL (ref 13.0–17.0)
LYMPHS PCT: 25.1 % (ref 12.0–46.0)
Lymphs Abs: 3.1 10*3/uL (ref 0.7–4.0)
MCHC: 34.1 g/dL (ref 30.0–36.0)
MCV: 89.8 fl (ref 78.0–100.0)
Monocytes Absolute: 0.6 10*3/uL (ref 0.1–1.0)
Monocytes Relative: 5.2 % (ref 3.0–12.0)
Neutro Abs: 8.3 10*3/uL — ABNORMAL HIGH (ref 1.4–7.7)
Neutrophils Relative %: 66 % (ref 43.0–77.0)
Platelets: 388 10*3/uL (ref 150.0–400.0)
RBC: 5.28 Mil/uL (ref 4.22–5.81)
RDW: 12.9 % (ref 11.5–15.5)
WBC: 12.5 10*3/uL — AB (ref 4.0–10.5)

## 2016-08-22 LAB — BASIC METABOLIC PANEL
BUN: 15 mg/dL (ref 6–23)
CALCIUM: 9.5 mg/dL (ref 8.4–10.5)
CO2: 33 mEq/L — ABNORMAL HIGH (ref 19–32)
Chloride: 100 mEq/L (ref 96–112)
Creatinine, Ser: 0.96 mg/dL (ref 0.40–1.50)
GFR: 87.77 mL/min (ref >60.00)
GLUCOSE: 129 mg/dL — AB (ref 70–99)
Potassium: 4 mEq/L (ref 3.5–5.1)
SODIUM: 139 meq/L (ref 135–145)

## 2016-08-22 LAB — HEPATIC FUNCTION PANEL
ALT: 19 U/L (ref 0–53)
AST: 18 U/L (ref 0–37)
Albumin: 4.2 g/dL (ref 3.5–5.2)
Alkaline Phosphatase: 55 U/L (ref 39–117)
Bilirubin, Direct: 0.1 mg/dL (ref 0.0–0.3)
TOTAL PROTEIN: 7.3 g/dL (ref 6.0–8.3)
Total Bilirubin: 0.4 mg/dL (ref 0.2–1.2)

## 2016-08-22 LAB — LIPASE: LIPASE: 168 U/L — AB (ref 11.0–59.0)

## 2016-08-22 NOTE — Telephone Encounter (Signed)
Pt would like results of labs done yesterday due to abdominal pain.

## 2016-08-23 ENCOUNTER — Other Ambulatory Visit: Payer: Self-pay

## 2016-08-23 MED ORDER — SILDENAFIL CITRATE 100 MG PO TABS: 100.0000 mg | ORAL_TABLET | ORAL | 11 refills | Status: DC | PRN

## 2016-08-23 NOTE — Telephone Encounter (Signed)
Please see result note annotations.

## 2017-01-14 ENCOUNTER — Encounter: Payer: Self-pay | Admitting: Family Medicine

## 2017-01-14 ENCOUNTER — Encounter: Payer: Self-pay | Admitting: Internal Medicine

## 2017-01-14 ENCOUNTER — Ambulatory Visit (INDEPENDENT_AMBULATORY_CARE_PROVIDER_SITE_OTHER): Payer: 59 | Admitting: Family Medicine

## 2017-01-14 VITALS — BP 136/84 | HR 107 | Resp 18 | Wt 240.0 lb

## 2017-01-14 DIAGNOSIS — Z23 Encounter for immunization: Secondary | ICD-10-CM

## 2017-01-14 DIAGNOSIS — Z0000 Encounter for general adult medical examination without abnormal findings: Secondary | ICD-10-CM

## 2017-01-14 DIAGNOSIS — R7303 Prediabetes: Secondary | ICD-10-CM | POA: Diagnosis not present

## 2017-01-14 DIAGNOSIS — R7989 Other specified abnormal findings of blood chemistry: Secondary | ICD-10-CM | POA: Diagnosis not present

## 2017-01-14 LAB — CBC WITH DIFFERENTIAL/PLATELET
Basophils Absolute: 0.1 10*3/uL (ref 0.0–0.1)
Basophils Relative: 0.9 % (ref 0.0–3.0)
EOS PCT: 4.3 % (ref 0.0–5.0)
Eosinophils Absolute: 0.4 10*3/uL (ref 0.0–0.7)
HEMATOCRIT: 48.8 % (ref 39.0–52.0)
Hemoglobin: 16.5 g/dL (ref 13.0–17.0)
LYMPHS ABS: 2.6 10*3/uL (ref 0.7–4.0)
LYMPHS PCT: 26.1 % (ref 12.0–46.0)
MCHC: 33.8 g/dL (ref 30.0–36.0)
MCV: 88.6 fl (ref 78.0–100.0)
Monocytes Absolute: 0.6 10*3/uL (ref 0.1–1.0)
Monocytes Relative: 6.3 % (ref 3.0–12.0)
NEUTROS ABS: 6.2 10*3/uL (ref 1.4–7.7)
NEUTROS PCT: 62.4 % (ref 43.0–77.0)
PLATELETS: 348 10*3/uL (ref 150.0–400.0)
RBC: 5.51 Mil/uL (ref 4.22–5.81)
RDW: 13.4 % (ref 11.5–15.5)
WBC: 10 10*3/uL (ref 4.0–10.5)

## 2017-01-14 LAB — BASIC METABOLIC PANEL
BUN: 16 mg/dL (ref 6–23)
CALCIUM: 9.3 mg/dL (ref 8.4–10.5)
CO2: 28 mEq/L (ref 19–32)
CREATININE: 0.95 mg/dL (ref 0.40–1.50)
Chloride: 102 mEq/L (ref 96–112)
GFR: 88.7 mL/min (ref >60.00)
Glucose, Bld: 96 mg/dL (ref 70–99)
Potassium: 4 mEq/L (ref 3.5–5.1)
Sodium: 137 mEq/L (ref 135–145)

## 2017-01-14 LAB — HEMOGLOBIN A1C: Hgb A1c MFr Bld: 6.1 % (ref 4.6–6.5)

## 2017-01-14 LAB — LIPID PANEL
CHOLESTEROL: 173 mg/dL (ref 0–200)
HDL: 30 mg/dL — ABNORMAL LOW (ref >39.00)
NonHDL: 142.78
TRIGLYCERIDES: 281 mg/dL — AB (ref 0.0–149.0)
Total CHOL/HDL Ratio: 6
VLDL: 56.2 mg/dL — ABNORMAL HIGH (ref 0.0–40.0)

## 2017-01-14 LAB — LDL CHOLESTEROL, DIRECT: LDL DIRECT: 110 mg/dL

## 2017-01-14 LAB — HEPATIC FUNCTION PANEL
ALBUMIN: 4.1 g/dL (ref 3.5–5.2)
ALK PHOS: 53 U/L (ref 39–117)
ALT: 15 U/L (ref 0–53)
AST: 16 U/L (ref 0–37)
Bilirubin, Direct: 0.1 mg/dL (ref 0.0–0.3)
TOTAL PROTEIN: 7.3 g/dL (ref 6.0–8.3)
Total Bilirubin: 0.5 mg/dL (ref 0.2–1.2)

## 2017-01-14 LAB — TSH: TSH: 0.67 u[IU]/mL (ref 0.35–4.50)

## 2017-01-14 LAB — PSA: PSA: 0.83 ng/mL (ref 0.10–4.00)

## 2017-01-14 NOTE — Patient Instructions (Signed)
Steps to Quit Smoking Smoking tobacco can be harmful to your health and can affect almost every organ in your body. Smoking puts you, and those around you, at risk for developing many serious chronic diseases. Quitting smoking is difficult, but it is one of the best things that you can do for your health. It is never too late to quit. What are the benefits of quitting smoking? When you quit smoking, you lower your risk of developing serious diseases and conditions, such as:  Lung cancer or lung disease, such as COPD.  Heart disease.  Stroke.  Heart attack.  Infertility.  Osteoporosis and bone fractures.  Additionally, symptoms such as coughing, wheezing, and shortness of breath may get better when you quit. You may also find that you get sick less often because your body is stronger at fighting off colds and infections. If you are pregnant, quitting smoking can help to reduce your chances of having a baby of low birth weight. How do I get ready to quit? When you decide to quit smoking, create a plan to make sure that you are successful. Before you quit:  Pick a date to quit. Set a date within the next two weeks to give you time to prepare.  Write down the reasons why you are quitting. Keep this list in places where you will see it often, such as on your bathroom mirror or in your car or wallet.  Identify the people, places, things, and activities that make you want to smoke (triggers) and avoid them. Make sure to take these actions: ? Throw away all cigarettes at home, at work, and in your car. ? Throw away smoking accessories, such as ashtrays and lighters. ? Clean your car and make sure to empty the ashtray. ? Clean your home, including curtains and carpets.  Tell your family, friends, and coworkers that you are quitting. Support from your loved ones can make quitting easier.  Talk with your health care provider about your options for quitting smoking.  Find out what treatment  options are covered by your health insurance.  What strategies can I use to quit smoking? Talk with your healthcare provider about different strategies to quit smoking. Some strategies include:  Quitting smoking altogether instead of gradually lessening how much you smoke over a period of time. Research shows that quitting "cold turkey" is more successful than gradually quitting.  Attending in-person counseling to help you build problem-solving skills. You are more likely to have success in quitting if you attend several counseling sessions. Even short sessions of 10 minutes can be effective.  Finding resources and support systems that can help you to quit smoking and remain smoke-free after you quit. These resources are most helpful when you use them often. They can include: ? Online chats with a counselor. ? Telephone quitlines. ? Printed self-help materials. ? Support groups or group counseling. ? Text messaging programs. ? Mobile phone applications.  Taking medicines to help you quit smoking. (If you are pregnant or breastfeeding, talk with your health care provider first.) Some medicines contain nicotine and some do not. Both types of medicines help with cravings, but the medicines that include nicotine help to relieve withdrawal symptoms. Your health care provider may recommend: ? Nicotine patches, gum, or lozenges. ? Nicotine inhalers or sprays. ? Non-nicotine medicine that is taken by mouth.  Talk with your health care provider about combining strategies, such as taking medicines while you are also receiving in-person counseling. Using these two strategies together   makes you more likely to succeed in quitting than if you used either strategy on its own. If you are pregnant or breastfeeding, talk with your health care provider about finding counseling or other support strategies to quit smoking. Do not take medicine to help you quit smoking unless told to do so by your health care  provider. What things can I do to make it easier to quit? Quitting smoking might feel overwhelming at first, but there is a lot that you can do to make it easier. Take these important actions:  Reach out to your family and friends and ask that they support and encourage you during this time. Call telephone quitlines, reach out to support groups, or work with a counselor for support.  Ask people who smoke to avoid smoking around you.  Avoid places that trigger you to smoke, such as bars, parties, or smoke-break areas at work.  Spend time around people who do not smoke.  Lessen stress in your life, because stress can be a smoking trigger for some people. To lessen stress, try: ? Exercising regularly. ? Deep-breathing exercises. ? Yoga. ? Meditating. ? Performing a body scan. This involves closing your eyes, scanning your body from head to toe, and noticing which parts of your body are particularly tense. Purposefully relax the muscles in those areas.  Download or purchase mobile phone or tablet apps (applications) that can help you stick to your quit plan by providing reminders, tips, and encouragement. There are many free apps, such as QuitGuide from the CDC (Centers for Disease Control and Prevention). You can find other support for quitting smoking (smoking cessation) through smokefree.gov and other websites.  How will I feel when I quit smoking? Within the first 24 hours of quitting smoking, you may start to feel some withdrawal symptoms. These symptoms are usually most noticeable 2-3 days after quitting, but they usually do not last beyond 2-3 weeks. Changes or symptoms that you might experience include:  Mood swings.  Restlessness, anxiety, or irritation.  Difficulty concentrating.  Dizziness.  Strong cravings for sugary foods in addition to nicotine.  Mild weight gain.  Constipation.  Nausea.  Coughing or a sore throat.  Changes in how your medicines work in your  body.  A depressed mood.  Difficulty sleeping (insomnia).  After the first 2-3 weeks of quitting, you may start to notice more positive results, such as:  Improved sense of smell and taste.  Decreased coughing and sore throat.  Slower heart rate.  Lower blood pressure.  Clearer skin.  The ability to breathe more easily.  Fewer sick days.  Quitting smoking is very challenging for most people. Do not get discouraged if you are not successful the first time. Some people need to make many attempts to quit before they achieve long-term success. Do your best to stick to your quit plan, and talk with your health care provider if you have any questions or concerns. This information is not intended to replace advice given to you by your health care provider. Make sure you discuss any questions you have with your health care provider. Document Released: 07/30/2001 Document Revised: 04/02/2016 Document Reviewed: 12/20/2014 Elsevier Interactive Patient Education  2017 Elsevier Inc.  

## 2017-01-14 NOTE — Progress Notes (Signed)
Subjective:     Patient ID: Gary Mora, male   DOB: [birth year], 51 y.o.   MRN: [id number]  HPI Patient here for physical exam. His chronic problems include history of obesity, hypertension, obstructive sleep apnea, kidney stones, nicotine use, metabolic syndrome, prediabetes. He went on low carb diet this past year and lost approximately 40 pounds. Has recently gained just a few pounds back. Plans to lose back the weight again by this fall. Feels much better overall. Never went for colonoscopy last year. No history of shingles vaccine. Still smokes about half pack cigarettes per day. No alcohol use.  Tetanus up-to-date. Patient has history of prediabetes. No family history of type 2 diabetes. Remains on Norvasc for hypertension. Blood pressure stable. No recent chest pains. Recurrent abdominal pain.  Past Medical History:  Diagnosis Date  . Allergy   . Chicken pox   . Chronic kidney disease    kidney stone  . Headache(784.0)    tension after using the computer  . Sleep apnea    Had surgery to correct no cpapa used  . Substance abuse    Past Surgical History:  Procedure Laterality Date  . TONSILLECTOMY  2006  . UVULOPALATOPHARYNGOPLASTY  2005    reports that he has been smoking Cigarettes.  He has a 30.00 pack-year smoking history. He has never used smokeless tobacco. He reports that he does not drink alcohol or use drugs. family history includes Arthritis in his father and mother; Hypothyroidism in his sister. Allergies  Allergen Reactions  . Asa Buff (Mag [Buffered Aspirin]     hives     Review of Systems  Constitutional: Negative for activity change, appetite change, fatigue and fever.  HENT: Negative for congestion, ear pain and trouble swallowing.   Eyes: Negative for pain and visual disturbance.  Respiratory: Negative for cough, shortness of breath and wheezing.   Cardiovascular: Negative for chest pain and palpitations.  Gastrointestinal: Negative for abdominal  distention, abdominal pain, blood in stool, constipation, diarrhea, nausea, rectal pain and vomiting.  Genitourinary: Negative for dysuria, hematuria and testicular pain.  Musculoskeletal: Negative for arthralgias and joint swelling.  Skin: Negative for rash.  Neurological: Negative for dizziness, syncope and headaches.  Hematological: Negative for adenopathy.  Psychiatric/Behavioral: Negative for confusion and dysphoric mood.       Objective:   Physical Exam  Constitutional: He is oriented to person, place, and time. He appears well-developed and well-nourished. No distress.  HENT:  Head: Normocephalic and atraumatic.  Right Ear: External ear normal.  Left Ear: External ear normal.  Mouth/Throat: Oropharynx is clear and moist.  Eyes: Conjunctivae and EOM are normal. Pupils are equal, round, and reactive to light.  Neck: Normal range of motion. Neck supple. No thyromegaly present.  Cardiovascular: Normal rate, regular rhythm and normal heart sounds.   No murmur heard. Pulmonary/Chest: No respiratory distress. He has no wheezes. He has no rales.  Abdominal: Soft. Bowel sounds are normal. He exhibits no distension and no mass. There is no tenderness. There is no rebound and no guarding.  Musculoskeletal: He exhibits no edema.  Lymphadenopathy:    He has no cervical adenopathy.  Neurological: He is alert and oriented to person, place, and time. He displays normal reflexes. No cranial nerve deficit.  Skin: No rash noted.  Psychiatric: He has a normal mood and affect.       Assessment:     Physical exam. Patient needs colonoscopy. Ongoing nicotine use as above. No history of shingles vaccine. Excellent  job with weight loss during the past year    Plan:     -Set up initial screening colonoscopy -We discussed new shingles vaccine and patient consents to going ahead with this -We discussed smoking cessation -Continue weight loss efforts -Obtain screening lab work including  hemoglobin A1c  Eulas Post MD [facility]

## 2017-01-14 NOTE — Addendum Note (Signed)
Addended by: Wyvonne Lenz on: 01/14/2017 10:30 AM   Modules accepted: Orders

## 2017-01-14 NOTE — Addendum Note (Signed)
Addended by: Wyvonne Lenz on: 01/14/2017 09:21 AM   Modules accepted: Orders

## 2017-01-30 ENCOUNTER — Ambulatory Visit (AMBULATORY_SURGERY_CENTER): Payer: 59

## 2017-01-30 VITALS — Ht 71.0 in | Wt 246.2 lb

## 2017-01-30 DIAGNOSIS — Z1211 Encounter for screening for malignant neoplasm of colon: Secondary | ICD-10-CM

## 2017-01-30 MED ORDER — PEG-KCL-NACL-NASULF-NA ASC-C 100 G PO SOLR: 1.0000 | Freq: Once | ORAL | 0 refills | Status: AC

## 2017-01-30 NOTE — Progress Notes (Signed)
Denies allergies to eggs or soy products. Denies complication of anesthesia or sedation. Denies use of weight loss medication. Denies use of O2.   Emmi instructions declined. Patient did not want to view.  When placing order entry for Suprep it was contraindicated for patients with an allergy to Buffered Aspirin. I spoke with Dr. Hilarie Fredrickson and he suggested that the patient use Moviprep instead. A sample of Moviprep was given to the patient.

## 2017-01-31 ENCOUNTER — Encounter: Payer: Self-pay | Admitting: Internal Medicine

## 2017-02-13 ENCOUNTER — Encounter: Payer: Self-pay | Admitting: Internal Medicine

## 2017-02-13 ENCOUNTER — Ambulatory Visit (AMBULATORY_SURGERY_CENTER): Payer: 59 | Admitting: Internal Medicine

## 2017-02-13 VITALS — BP 122/83 | HR 91 | Temp 98.2°F | Resp 14 | Ht 71.0 in | Wt 246.0 lb

## 2017-02-13 DIAGNOSIS — Z1211 Encounter for screening for malignant neoplasm of colon: Secondary | ICD-10-CM

## 2017-02-13 DIAGNOSIS — Z1212 Encounter for screening for malignant neoplasm of rectum: Secondary | ICD-10-CM

## 2017-02-13 DIAGNOSIS — D126 Benign neoplasm of colon, unspecified: Secondary | ICD-10-CM

## 2017-02-13 DIAGNOSIS — D123 Benign neoplasm of transverse colon: Secondary | ICD-10-CM

## 2017-02-13 DIAGNOSIS — D127 Benign neoplasm of rectosigmoid junction: Secondary | ICD-10-CM

## 2017-02-13 DIAGNOSIS — K635 Polyp of colon: Secondary | ICD-10-CM | POA: Diagnosis not present

## 2017-02-13 DIAGNOSIS — D120 Benign neoplasm of cecum: Secondary | ICD-10-CM

## 2017-02-13 DIAGNOSIS — D122 Benign neoplasm of ascending colon: Secondary | ICD-10-CM | POA: Diagnosis not present

## 2017-02-13 DIAGNOSIS — D125 Benign neoplasm of sigmoid colon: Secondary | ICD-10-CM

## 2017-02-13 MED ORDER — SODIUM CHLORIDE 0.9 % IV SOLN: 500.0000 mL | INTRAVENOUS | Status: DC

## 2017-02-13 NOTE — Patient Instructions (Signed)
YOU HAD AN ENDOSCOPIC PROCEDURE TODAY AT [facility]:   Refer to the procedure report that was given to you for any specific questions about what was found during the examination.  If the procedure report does not answer your questions, please call your gastroenterologist to clarify.  If you requested that your care partner not be given the details of your procedure findings, then the procedure report has been included in a sealed envelope for you to review at your convenience later.  YOU SHOULD EXPECT: Some feelings of bloating in the abdomen. Passage of more gas than usual.  Walking can help get rid of the air that was put into your GI tract during the procedure and reduce the bloating. If you had a lower endoscopy (such as a colonoscopy or flexible sigmoidoscopy) you may notice spotting of blood in your stool or on the toilet paper. If you underwent a bowel prep for your procedure, you may not have a normal bowel movement for a few days.  Please Note:  You might notice some irritation and congestion in your nose or some drainage.  This is from the oxygen used during your procedure.  There is no need for concern and it should clear up in a day or so.  SYMPTOMS TO REPORT IMMEDIATELY:   Following lower endoscopy (colonoscopy or flexible sigmoidoscopy):  Excessive amounts of blood in the stool  Significant tenderness or worsening of abdominal pains  Swelling of the abdomen that is new, acute  Fever of 100F or higher   For urgent or emergent issues, a gastroenterologist can be reached at any hour by calling [phone].   DIET:  We do recommend a small meal at first, but then you may proceed to your regular diet.  Drink plenty of fluids but you should avoid alcoholic beverages for 24 hours.  ACTIVITY:  You should plan to take it easy for the rest of today and you should NOT DRIVE or use heavy machinery until tomorrow (because of the sedation medicines used during the test).     FOLLOW UP: Our staff will call the number listed on your records the next business day following your procedure to check on you and address any questions or concerns that you may have regarding the information given to you following your procedure. If we do not reach you, we will leave a message.  However, if you are feeling well and you are not experiencing any problems, there is no need to return our call.  We will assume that you have returned to your regular daily activities without incident.  If any biopsies were taken you will be contacted by phone or by letter within the next 1-3 weeks.  Please call us at [phone] if you have not heard about the biopsies in 3 weeks.    SIGNATURES/CONFIDENTIALITY: You and/or your care partner have signed paperwork which will be entered into your electronic medical record.  These signatures attest to the fact that that the information above on your After Visit Summary has been reviewed and is understood.  Full responsibility of the confidentiality of this discharge information lies with you and/or your care-partner.   Handouts were given to your care partner on polyps, diverticulosis, hemorrhoids, and a high fiber diet with liberal fluid intake. NO ASPIRIN, ASPIRIN CONTAINING PRODUCTS (BC OR GOODY POWDERS) OR NSAIDS (IBUPROFEN, ADVIL, ALEVE, AND MOTRIN) FOR 2 weeks; TYLENOL IS OK TO TAKE. You may resume your other current medications today. Await biopsy  results. Please call if any questions or concerns.

## 2017-02-13 NOTE — Progress Notes (Signed)
Report given to PACU, vss 

## 2017-02-13 NOTE — Progress Notes (Signed)
Pt's states no medical or surgical changes since previsit or office visit. 

## 2017-02-13 NOTE — Op Note (Signed)
[facility] Patient Name: Gary Mora Procedure Date: 02/13/2017 11:10 AM MRN: [id number] Endoscopist: Jerene Bears , MD Age: 51 Referring MD:  Date of Birth: 1966/06/13 Gender: Male Account #: [id number] Procedure:                Colonoscopy Indications:              Screening for colorectal malignant neoplasm, This                            is the patient's first colonoscopy Medicines:                Monitored Anesthesia Care Procedure:                Pre-Anesthesia Assessment:                           - Prior to the procedure, a History and Physical                            was performed, and patient medications and                            allergies were reviewed. The patient's tolerance of                            previous anesthesia was also reviewed. The risks                            and benefits of the procedure and the sedation                            options and risks were discussed with the patient.                            All questions were answered, and informed consent                            was obtained. Prior Anticoagulants: The patient has                            taken no previous anticoagulant or antiplatelet                            agents. ASA Grade Assessment: II - A patient with                            mild systemic disease. After reviewing the risks                            and benefits, the patient was deemed in                            satisfactory condition to undergo the procedure.  After obtaining informed consent, the colonoscope                            was passed under direct vision. Throughout the                            procedure, the patient's blood pressure, pulse, and                            oxygen saturations were monitored continuously. The                            Colonoscope was introduced through the anus and                            advanced to the the cecum,  identified by                            appendiceal orifice and ileocecal valve. The                            colonoscopy was performed without difficulty. The                            patient tolerated the procedure well. The quality                            of the bowel preparation was good. The ileocecal                            valve, appendiceal orifice, and rectum were                            photographed. Scope In: 11:25:55 AM Scope Out: 91:79:15 AM Scope Withdrawal Time: 0 hours 26 minutes 47 seconds  Total Procedure Duration: 0 hours 30 minutes 46 seconds  Findings:                 The digital rectal exam was normal.                           A 4 mm polyp was found in the cecum. The polyp was                            sessile. The polyp was removed with a cold snare.                            Resection and retrieval were complete.                           A 4 mm polyp was found in the ascending colon. The                            polyp was sessile. The polyp was removed  with a                            cold snare. Resection and retrieval were complete.                           Four sessile polyps were found in the transverse                            colon. The polyps were 4 to 7 mm in size. These                            polyps were removed with a cold snare. Resection                            and retrieval were complete.                           A 5 mm polyp was found in the sigmoid colon. The                            polyp was sessile. The polyp was removed with a                            cold snare. Resection and retrieval were complete.                           A 10 mm polyp was found in the sigmoid colon. The                            polyp was pedunculated. The polyp was removed with                            a hot snare. Resection and retrieval were complete.                           Eight sessile polyps were found in the rectum,                             recto-sigmoid colon and distal sigmoid colon. The                            polyps were 4 to 9 mm in size. These polyps were                            removed with a cold snare. Resection and retrieval                            were complete.                           Multiple small and large-mouthed diverticula were  found in the sigmoid colon, descending colon,                            ascending colon and cecum.                           Internal hemorrhoids were found during                            retroflexion. The hemorrhoids were small. Complications:            No immediate complications. Estimated Blood Loss:     Estimated blood loss was minimal. Impression:               - One 4 mm polyp in the cecum, removed with a cold                            snare. Resected and retrieved.                           - One 4 mm polyp in the ascending colon, removed                            with a cold snare. Resected and retrieved.                           - Four 4 to 7 mm polyps in the transverse colon,                            removed with a cold snare. Resected and retrieved.                           - One 5 mm polyp in the sigmoid colon, removed with                            a cold snare. Resected and retrieved.                           - One 10 mm polyp in the sigmoid colon, removed                            with a hot snare. Resected and retrieved.                           - Eight 4 to 9 mm polyps in the rectum, at the                            recto-sigmoid colon and in the distal sigmoid                            colon, removed with a cold snare. Resected and  retrieved.                           - Moderate diverticulosis in the sigmoid colon, in                            the descending colon, in the ascending colon and in                            the cecum.                           -  Internal hemorrhoids. Recommendation:           - Patient has a contact number available for                            emergencies. The signs and symptoms of potential                            delayed complications were discussed with the                            patient. Return to normal activities tomorrow.                            Written discharge instructions were provided to the                            patient.                           - Resume previous diet.                           - Continue present medications.                           - Await pathology results.                           - Repeat colonoscopy is recommended for                            surveillance. The colonoscopy date will be                            determined after pathology results from today's                            exam become available for review.                           - No ibuprofen, naproxen, or other non-steroidal                            anti-inflammatory drugs for 2 weeks after polyp  removal. Jerene Bears, MD 02/13/2017 12:03:23 PM This report has been signed electronically.

## 2017-02-13 NOTE — Progress Notes (Signed)
Called to room to assist during endoscopic procedure.  Patient ID and intended procedure confirmed with present staff. Received instructions for my participation in the procedure from the performing physician.  

## 2017-02-14 ENCOUNTER — Telehealth: Payer: Self-pay | Admitting: *Deleted

## 2017-02-14 NOTE — Telephone Encounter (Signed)
  Follow up Call-  Call back number 02/13/2017  Post procedure Call Back phone  # [phone]  Permission to leave phone message Yes  Some recent data might be hidden     Patient questions:  Do you have a fever, pain , or abdominal swelling? No. Pain Score  0 *  Have you tolerated food without any problems? Yes.    Have you been able to return to your normal activities? Yes.    Do you have any questions about your discharge instructions: Diet   No. Medications  No. Follow up visit  No.  Do you have questions or concerns about your Care? No.  Actions: * If pain score is 4 or above: No action needed, pain <4.

## 2017-02-21 ENCOUNTER — Encounter: Payer: Self-pay | Admitting: Internal Medicine

## 2017-03-17 ENCOUNTER — Ambulatory Visit (INDEPENDENT_AMBULATORY_CARE_PROVIDER_SITE_OTHER): Payer: 59

## 2017-03-17 DIAGNOSIS — Z23 Encounter for immunization: Secondary | ICD-10-CM

## 2017-03-17 NOTE — Progress Notes (Signed)
Pt here today to receive his 2nd Shingles vaccine. Pt tolerated first injection well just some soreness. He had no additional questions at this time.

## 2017-05-05 ENCOUNTER — Other Ambulatory Visit: Payer: Self-pay | Admitting: Family Medicine

## 2017-06-03 ENCOUNTER — Encounter: Payer: Self-pay | Admitting: Family Medicine

## 2017-06-03 ENCOUNTER — Ambulatory Visit (INDEPENDENT_AMBULATORY_CARE_PROVIDER_SITE_OTHER)
Admission: RE | Admit: 2017-06-03 | Discharge: 2017-06-03 | Disposition: A | Discharge status: Home / Self Care | Payer: 59 | Source: Ambulatory Visit | Attending: Family Medicine | Admitting: Family Medicine

## 2017-06-03 ENCOUNTER — Ambulatory Visit (INDEPENDENT_AMBULATORY_CARE_PROVIDER_SITE_OTHER): Payer: 59 | Admitting: Family Medicine

## 2017-06-03 VITALS — BP 120/80 | HR 90 | Temp 98.3°F | Ht 71.0 in | Wt 255.0 lb

## 2017-06-03 DIAGNOSIS — J989 Respiratory disorder, unspecified: Secondary | ICD-10-CM | POA: Diagnosis not present

## 2017-06-03 DIAGNOSIS — R05 Cough: Secondary | ICD-10-CM

## 2017-06-03 DIAGNOSIS — R059 Cough, unspecified: Secondary | ICD-10-CM

## 2017-06-03 MED ORDER — BENZONATATE 100 MG PO CAPS: 100.0000 mg | ORAL_CAPSULE | Freq: Three times a day (TID) | ORAL | 0 refills | Status: DC | PRN

## 2017-06-03 NOTE — Patient Instructions (Signed)
BEFORE YOU LEAVE: -xray sheet  Go get the xray  Tessalon as needed for cough  I hope you are feeling better soon! Follow up promptly if worsening, new concerns or you are not improving as expected.

## 2017-06-03 NOTE — Progress Notes (Signed)
HPI:  Acute visit for cough and congestion: -started: 4-5 days ago -symptoms:nasal congestion, sore throat, cough, ? Fever initially, malaise, some mild body aches - improving but cough worse and felt tired today - reports hx pneumonia -denies:fever, SOB, NVD, tooth pain, rash -has tried: nothing -sick contacts/travel/risks: no reported flu, strep or tick exposure - reports grandkids with similar -Hx of: pneumonia several times, smoker  ROS: See pertinent positives and negatives per HPI.  Past Medical History:  Diagnosis Date  . Allergy   . Chicken pox   . Chronic kidney disease    kidney stone  . Headache(784.0)    tension after using the computer  . Hypertension   . Sleep apnea    Had surgery to correct no cpapa used  . Substance abuse [facility])     Past Surgical History:  Procedure Laterality Date  . MOUTH SURGERY    . TONSILLECTOMY  2006  . UVULOPALATOPHARYNGOPLASTY  2005    Family History  Problem Relation Age of Onset  . Arthritis Mother   . Arthritis Father   . Hypothyroidism Sister   . Colon cancer Neg Hx   . Esophageal cancer Neg Hx   . Rectal cancer Neg Hx   . Stomach cancer Neg Hx     Social History   Social History  . Marital status: Divorced    Spouse name: N/A  . Number of children: N/A  . Years of education: N/A   Social History Main Topics  . Smoking status: Current Every Day Smoker    Packs/day: 1.00    Years: 30.00    Types: Cigarettes  . Smokeless tobacco: Never Used  . Alcohol use No  . Drug use: No  . Sexual activity: Not Asked   Other Topics Concern  . None   Social History Narrative  . None     Current Outpatient Prescriptions:  .  amLODipine (NORVASC) 5 MG tablet, TAKE 1 TABLET EVERY DAY, Disp: 90 tablet, Rfl: 2 .  Ascorbic Acid (VITAMIN C) 100 MG tablet, Take 100 mg by mouth daily.  , Disp: , Rfl:  .  desoximetasone (TOPICORT) 0.25 % cream, Apply 1 application topically 2 (two) times daily as needed., Disp: 30 g, Rfl:  2 .  loratadine (CLARITIN) 10 MG tablet, Take 10 mg by mouth daily., Disp: , Rfl:  .  multivitamin-iron-minerals-folic acid (CENTRUM) chewable tablet, Chew 1 tablet by mouth daily.  , Disp: , Rfl:  .  naproxen sodium (ANAPROX) 220 MG tablet, Take 220 mg by mouth 2 (two) times daily with a meal., Disp: , Rfl:  .  sildenafil (REVATIO) 20 MG tablet, 2-5 tablets prior to intercourse. Do not repeat in 24 hours., Disp: 50 tablet, Rfl: 0 .  benzonatate (TESSALON PERLES) 100 MG capsule, Take 1 capsule (100 mg total) by mouth 3 (three) times daily as needed., Disp: 20 capsule, Rfl: 0  Current Facility-Administered Medications:  .  0.9 %  sodium chloride infusion, 500 mL, Intravenous, Continuous, Pyrtle, Lajuan Lines, MD  EXAM:  Vitals:   06/03/17 0946  BP: 120/80  Pulse: 90  Temp: 98.3 F (36.8 C)  SpO2: 97%    Body mass index is 35.57 kg/m.  GENERAL: vitals reviewed and listed above, alert, oriented, appears well hydrated and in no acute distress  HEENT: atraumatic, conjunttiva clear, no obvious abnormalities on inspection of external nose and ears, normal appearance of ear canals and TMs, clear nasal congestion, mild post oropharyngeal erythema with PND, no tonsillar edema  or exudate, no sinus TTP  NECK: no obvious masses on inspection  LUNGS: clear to auscultation bilaterally, no wheezes, rales or rhonchi, good air movement except for questionable rhonchorous breath sounds in the right middle lobe  CV: HRRR, no peripheral edema  MS: moves all extremities without noticeable abnormality  PSYCH: pleasant and cooperative, no obvious depression or anxiety  ASSESSMENT AND PLAN:  Discussed the following assessment and plan:  Cough - Plan: DG Chest 2 View  Respiratory illness - Plan: DG Chest 2 View  - We discussed potential etiologies,I will check a chest x-ray to exclude pneumonia. Tessalon for cough. Antibiotic if pneumonia on chest x-ray.We discussed treatment side effects, likely  course, antibiotic misuse, transmission, return precautions and signs of developing a serious illness. -of course, we advised to return or notify a doctor immediately if symptoms worsen or persist or new concerns arise.    Patient Instructions  BEFORE YOU LEAVE: -xray sheet  Go get the xray  Tessalon as needed for cough  I hope you are feeling better soon! Follow up promptly if worsening, new concerns or you are not improving as expected.     Colin Benton R., DO

## 2017-09-03 ENCOUNTER — Encounter: Payer: Self-pay | Admitting: Family Medicine

## 2017-09-03 ENCOUNTER — Ambulatory Visit: Payer: 59 | Admitting: Family Medicine

## 2017-09-03 ENCOUNTER — Ambulatory Visit: Payer: Self-pay

## 2017-09-03 VITALS — BP 140/80 | HR 108 | Temp 97.9°F | Resp 12 | Ht 71.0 in | Wt 263.0 lb

## 2017-09-03 DIAGNOSIS — I10 Essential (primary) hypertension: Secondary | ICD-10-CM | POA: Diagnosis not present

## 2017-09-03 DIAGNOSIS — R739 Hyperglycemia, unspecified: Secondary | ICD-10-CM

## 2017-09-03 DIAGNOSIS — R5383 Other fatigue: Secondary | ICD-10-CM | POA: Diagnosis not present

## 2017-09-03 DIAGNOSIS — F17200 Nicotine dependence, unspecified, uncomplicated: Secondary | ICD-10-CM

## 2017-09-03 DIAGNOSIS — R000 Tachycardia, unspecified: Secondary | ICD-10-CM

## 2017-09-03 LAB — BASIC METABOLIC PANEL
BUN: 15 mg/dL (ref 6–23)
CALCIUM: 9.4 mg/dL (ref 8.4–10.5)
CO2: 29 mEq/L (ref 19–32)
CREATININE: 0.91 mg/dL (ref 0.40–1.50)
Chloride: 100 mEq/L (ref 96–112)
GFR: 92.98 mL/min (ref >60.00)
GLUCOSE: 150 mg/dL — AB (ref 70–99)
POTASSIUM: 3.9 meq/L (ref 3.5–5.1)
Sodium: 137 mEq/L (ref 135–145)

## 2017-09-03 LAB — HEMOGLOBIN A1C: HEMOGLOBIN A1C: 6.5 % (ref 4.6–6.5)

## 2017-09-03 MED ORDER — VARENICLINE TARTRATE 0.5 MG X 11 & 1 MG X 42 PO MISC: ORAL | 0 refills | Status: AC

## 2017-09-03 MED ORDER — VARENICLINE TARTRATE 1 MG PO TABS: 1.0000 mg | ORAL_TABLET | Freq: Two times a day (BID) | ORAL | 1 refills | Status: DC

## 2017-09-03 NOTE — Telephone Encounter (Signed)
Pt is on the schedule for 10:30 am this morning here.

## 2017-09-03 NOTE — Telephone Encounter (Signed)
   Reason for Disposition . Systolic BP  >= 355 OR Diastolic >= 974  Answer Assessment - Initial Assessment Questions 1. BLOOD PRESSURE: "What is the blood pressure?" "Did you take at least two measurements 5 minutes apart?"     155/104 2. ONSET: "When did you take your blood pressure?"     This morning 3. HOW: "How did you obtain the blood pressure?" (e.g., visiting nurse, automatic home BP monitor)     Home blood pressure monitor 4. HISTORY: "Do you have a history of high blood pressure?"     Yes 5. MEDICATIONS: "Are you taking any medications for blood pressure?" "Have you missed any doses recently?"     Taking medication as ordered 6. OTHER SYMPTOMS: "Do you have any symptoms?" (e.g., headache, chest pain, blurred vision, difficulty breathing, weakness)     Headache, tired, not focused 7. PREGNANCY: "Is there any chance you are pregnant?" "When was your last menstrual period?"     No  Protocols used: HIGH BLOOD PRESSURE-A-AH  C/ o Headache and feeling tired and "not focused." Appointment made.

## 2017-09-03 NOTE — Telephone Encounter (Signed)
Pt is here now for office visit.

## 2017-09-03 NOTE — Patient Instructions (Signed)
[name] I have seen you today for an acute visit.  A few things to remember from today's visit:   Essential hypertension - Plan: Basic metabolic panel, Hemoglobin A1c  Tobacco use disorder  Fatigue, unspecified type   Medications prescribed today are intended for short period of time and will not be refill upon request, a follow up appointment might be necessary to discuss continuation of of treatment if appropriate.  Monitor blood pressure daily.   Smoking cessation will help.   DASH Eating Plan DASH stands for "Dietary Approaches to Stop Hypertension." The DASH eating plan is a healthy eating plan that has been shown to reduce high blood pressure (hypertension). It may also reduce your risk for type 2 diabetes, heart disease, and stroke. The DASH eating plan may also help with weight loss. What are tips for following this plan? General guidelines  Avoid eating more than 2,300 mg (milligrams) of salt (sodium) a day. If you have hypertension, you may need to reduce your sodium intake to 1,500 mg a day.  Limit alcohol intake to no more than 1 drink a day for nonpregnant women and 2 drinks a day for men. One drink equals 12 oz of beer, 5 oz of wine, or 1 oz of hard liquor.  Work with your health care provider to maintain a healthy body weight or to lose weight. Ask what an ideal weight is for you.  Get at least 30 minutes of exercise that causes your heart to beat faster (aerobic exercise) most days of the week. Activities may include walking, swimming, or biking.  Work with your health care provider or diet and nutrition specialist (dietitian) to adjust your eating plan to your individual calorie needs. Reading food labels  Check food labels for the amount of sodium per serving. Choose foods with less than 5 percent of the Daily Value of sodium. Generally, foods with less than 300 mg of sodium per serving fit into this eating plan.  To find whole grains, look for the  word "whole" as the first word in the ingredient list. Shopping  Buy products labeled as "low-sodium" or "no salt added."  Buy fresh foods. Avoid canned foods and premade or frozen meals. Cooking  Avoid adding salt when cooking. Use salt-free seasonings or herbs instead of table salt or sea salt. Check with your health care provider or pharmacist before using salt substitutes.  Do not fry foods. Cook foods using healthy methods such as baking, boiling, grilling, and broiling instead.  Cook with heart-healthy oils, such as olive, canola, soybean, or sunflower oil. Meal planning   Eat a balanced diet that includes: ? 5 or more servings of fruits and vegetables each day. At each meal, try to fill half of your plate with fruits and vegetables. ? Up to 6-8 servings of whole grains each day. ? Less than 6 oz of lean meat, poultry, or fish each day. A 3-oz serving of meat is about the same size as a deck of cards. One egg equals 1 oz. ? 2 servings of low-fat dairy each day. ? A serving of nuts, seeds, or beans 5 times each week. ? Heart-healthy fats. Healthy fats called Omega-3 fatty acids are found in foods such as flaxseeds and coldwater fish, like sardines, salmon, and mackerel.  Limit how much you eat of the following: ? Canned or prepackaged foods. ? Food that is high in trans fat, such as fried foods. ? Food that is high in saturated fat, such  as fatty meat. ? Sweets, desserts, sugary drinks, and other foods with added sugar. ? Full-fat dairy products.  Do not salt foods before eating.  Try to eat at least 2 vegetarian meals each week.  Eat more home-cooked food and less restaurant, buffet, and fast food.  When eating at a restaurant, ask that your food be prepared with less salt or no salt, if possible. What foods are recommended? The items listed may not be a complete list. Talk with your dietitian about what dietary choices are best for you. Grains Whole-grain or  whole-wheat bread. Whole-grain or whole-wheat pasta. Brown rice. [place]. Bulgur. Whole-grain and low-sodium cereals. Pita bread. Low-fat, low-sodium crackers. Whole-wheat flour tortillas. Vegetables Fresh or frozen vegetables (raw, steamed, roasted, or grilled). Low-sodium or reduced-sodium tomato and vegetable juice. Low-sodium or reduced-sodium tomato sauce and tomato paste. Low-sodium or reduced-sodium canned vegetables. Fruits All fresh, dried, or frozen fruit. Canned fruit in natural juice (without added sugar). Meat and other protein foods Skinless chicken or [place]. Ground chicken or [place]. Pork with fat trimmed off. Fish and seafood. Egg whites. Dried beans, peas, or lentils. Unsalted nuts, nut butters, and seeds. Unsalted canned beans. Lean cuts of beef with fat trimmed off. Low-sodium, lean deli meat. Dairy Low-fat (1%) or fat-free (skim) milk. Fat-free, low-fat, or reduced-fat cheeses. Nonfat, low-sodium ricotta or cottage cheese. Low-fat or nonfat yogurt. Low-fat, low-sodium cheese. Fats and oils Soft margarine without trans fats. Vegetable oil. Low-fat, reduced-fat, or light mayonnaise and salad dressings (reduced-sodium). Canola, safflower, olive, soybean, and sunflower oils. Avocado. Seasoning and other foods Herbs. Spices. Seasoning mixes without salt. Unsalted popcorn and pretzels. Fat-free sweets. What foods are not recommended? The items listed may not be a complete list. Talk with your dietitian about what dietary choices are best for you. Grains Baked goods made with fat, such as croissants, muffins, or some breads. Dry pasta or rice meal packs. Vegetables Creamed or fried vegetables. Vegetables in a cheese sauce. Regular canned vegetables (not low-sodium or reduced-sodium). Regular canned tomato sauce and paste (not low-sodium or reduced-sodium). Regular tomato and vegetable juice (not low-sodium or reduced-sodium). Angie Fava. Olives. Fruits Canned fruit in a light  or heavy syrup. Fried fruit. Fruit in cream or butter sauce. Meat and other protein foods Fatty cuts of meat. Ribs. Fried meat. Berniece Salines. Sausage. Bologna and other processed lunch meats. Salami. Fatback. Hotdogs. Bratwurst. Salted nuts and seeds. Canned beans with added salt. Canned or smoked fish. Whole eggs or egg yolks. Chicken or [place] with skin. Dairy Whole or 2% milk, cream, and half-and-half. Whole or full-fat cream cheese. Whole-fat or sweetened yogurt. Full-fat cheese. Nondairy creamers. Whipped toppings. Processed cheese and cheese spreads. Fats and oils Butter. Stick margarine. Lard. Shortening. Ghee. Bacon fat. Tropical oils, such as coconut, palm kernel, or palm oil. Seasoning and other foods Salted popcorn and pretzels. Onion salt, garlic salt, seasoned salt, table salt, and sea salt. Worcestershire sauce. Tartar sauce. Barbecue sauce. Teriyaki sauce. Soy sauce, including reduced-sodium. Steak sauce. Canned and packaged gravies. Fish sauce. Oyster sauce. Cocktail sauce. Horseradish that you find on the shelf. Ketchup. Mustard. Meat flavorings and tenderizers. Bouillon cubes. Hot sauce and Tabasco sauce. Premade or packaged marinades. Premade or packaged taco seasonings. Relishes. Regular salad dressings. Where to find more information:  National Heart, Lung, and [place]: https://wilson-eaton.com/  American Heart Association: www.heart.org Summary  The DASH eating plan is a healthy eating plan that has been shown to reduce high blood pressure (hypertension). It may also reduce your risk  for type 2 diabetes, heart disease, and stroke.  With the DASH eating plan, you should limit salt (sodium) intake to 2,300 mg a day. If you have hypertension, you may need to reduce your sodium intake to 1,500 mg a day.  When on the DASH eating plan, aim to eat more fresh fruits and vegetables, whole grains, lean proteins, low-fat dairy, and heart-healthy fats.  Work with your health care provider or  diet and nutrition specialist (dietitian) to adjust your eating plan to your individual calorie needs. This information is not intended to replace advice given to you by your health care provider. Make sure you discuss any questions you have with your health care provider. Document Released: 07/25/2011 Document Revised: 07/29/2016 Document Reviewed: 07/29/2016 Elsevier Interactive Patient Education  [employer].  In general please monitor for signs of worsening symptoms and seek immediate medical attention if any concerning.    I hope you get better soon!

## 2017-09-03 NOTE — Progress Notes (Addendum)
ACUTE VISIT   HPI:  Chief Complaint  Patient presents with  . Hypertension    started about 10 days ago, unable to focus or concentarte at work, slight headache, feels like one is going to come on. B/P lastnight 156/99, HR: 121, this morning: 155/104, HR: 115    [name] is a 52 y.o. male, who is here today complaining of elevated BP for the past few days. He usually does not check BP but started doing so this week he took BP when he was having a mild headache. Occipital pressure headache.  Hx of HTN, last F/U 12/2016. He is on Norvasc 5 mg daily.  Headache has resolved. He has not been consistent with low salt diet.  Denies chest pain, dyspnea, palpitation, claudication, focal weakness, or edema.  Yesterday vision "a little off" with eye glasses and better with no glasses.Today vision is back to his baseline.  + Fatigue.  Sleeps "fine."   Lab Results  Component Value Date   WBC 10.0 01/14/2017   HGB 16.5 01/14/2017   HCT 48.8 01/14/2017   MCV 88.6 01/14/2017   PLT 348.0 01/14/2017   Lab Results  Component Value Date   TSH 0.67 01/14/2017    Lab Results  Component Value Date   CREATININE 0.95 01/14/2017   BUN 16 01/14/2017   NA 137 01/14/2017   K 4.0 01/14/2017   CL 102 01/14/2017   CO2 28 01/14/2017   Lab Results  Component Value Date   WBC 10.0 01/14/2017   HGB 16.5 01/14/2017   HCT 48.8 01/14/2017   MCV 88.6 01/14/2017   PLT 348.0 01/14/2017   + Smoker. He has tried Chantix before and it helped with decreasing smoking, did not completed treatment. He is interested in trying Chantis for smoking cessation again. He denies side effects.  Hx of OSA, he does not use CPAP.  Denies Hx of DM, has had elevated glucose in the past.  + Polydipsia, no changes in urine frequency.   Lab Results  Component Value Date   HGBA1C 6.1 01/14/2017    Review of Systems  Constitutional: Positive for fatigue. Negative for activity change,  appetite change and fever.  HENT: Negative for nosebleeds, sore throat and trouble swallowing.   Eyes: Negative for redness and visual disturbance.  Respiratory: Negative for cough, shortness of breath and wheezing.   Cardiovascular: Negative for chest pain, palpitations and leg swelling.  Gastrointestinal: Negative for abdominal pain, nausea and vomiting.  Endocrine: Positive for polydipsia. Negative for cold intolerance, heat intolerance, polyphagia and polyuria.  Genitourinary: Negative for decreased urine volume, dysuria and hematuria.  Musculoskeletal: Negative for gait problem and myalgias.  Skin: Negative for rash and wound.  Neurological: Positive for headaches (resolved). Negative for dizziness, seizures, syncope, facial asymmetry, weakness and numbness.  Hematological: Negative for adenopathy. Does not bruise/bleed easily.  Psychiatric/Behavioral: Negative for confusion and sleep disturbance. The patient is not nervous/anxious.       Current Outpatient Medications on File Prior to Visit  Medication Sig Dispense Refill  . amLODipine (NORVASC) 5 MG tablet TAKE 1 TABLET EVERY DAY 90 tablet 2  . Ascorbic Acid (VITAMIN C) 100 MG tablet Take 100 mg by mouth daily.      . benzonatate (TESSALON PERLES) 100 MG capsule Take 1 capsule (100 mg total) by mouth 3 (three) times daily as needed. 20 capsule 0  . desoximetasone (TOPICORT) 0.25 % cream Apply 1 application topically 2 (two) times daily as  needed. 30 g 2  . loratadine (CLARITIN) 10 MG tablet Take 10 mg by mouth daily.    . multivitamin-iron-minerals-folic acid (CENTRUM) chewable tablet Chew 1 tablet by mouth daily.      . naproxen sodium (ANAPROX) 220 MG tablet Take 220 mg by mouth 2 (two) times daily with a meal.    . sildenafil (REVATIO) 20 MG tablet 2-5 tablets prior to intercourse. Do not repeat in 24 hours. 50 tablet 0   Current Facility-Administered Medications on File Prior to Visit  Medication Dose Route Frequency Provider  Last Rate Last Dose  . 0.9 %  sodium chloride infusion  500 mL Intravenous Continuous Pyrtle, Lajuan Lines, MD         Past Medical History:  Diagnosis Date  . Allergy   . Chicken pox   . Chronic kidney disease    kidney stone  . Headache(784.0)    tension after using the computer  . Hypertension   . Sleep apnea    Had surgery to correct no cpapa used  . Substance abuse (HCC)    Allergies  Allergen Reactions  . Asa Buff (Mag [Buffered Aspirin]     hives    Social History   Socioeconomic History  . Marital status: Divorced    Spouse name: None  . Number of children: None  . Years of education: None  . Highest education level: None  Social Needs  . Financial resource strain: None  . Food insecurity - worry: None  . Food insecurity - inability: None  . Transportation needs - medical: None  . Transportation needs - non-medical: None  Occupational History  . None  Tobacco Use  . Smoking status: Current Every Day Smoker    Packs/day: 1.00    Years: 30.00    Pack years: 30.00    Types: Cigarettes  . Smokeless tobacco: Never Used  Substance and Sexual Activity  . Alcohol use: No  . Drug use: No  . Sexual activity: None  Other Topics Concern  . None  Social History Narrative  . None    Vitals:   09/03/17 1040 09/03/17 1128  BP: (!) 152/88 140/80  Pulse: (!) 111 (!) 108  Resp: 12   Temp: 97.9 F (36.6 C)   SpO2: 95%    Body mass index is 36.68 kg/m.    Physical Exam  Nursing note and vitals reviewed. Constitutional: He is oriented to person, place, and time. He appears well-developed. No distress.  HENT:  Head: Normocephalic and atraumatic.  Mouth/Throat: Oropharynx is clear and moist and mucous membranes are normal.  Eyes: Conjunctivae are normal. Pupils are equal, round, and reactive to light.  Neck: No JVD present. No tracheal deviation present. No thyroid mass and no thyromegaly present.  Cardiovascular: Regular rhythm. Tachycardia present.  No  murmur heard. Pulses:      Posterior tibial pulses are 2+ on the right side, and 2+ on the left side.  Respiratory: Effort normal and breath sounds normal. No respiratory distress.  GI: Soft. He exhibits no mass. There is no hepatomegaly. There is no tenderness.  Musculoskeletal: He exhibits no edema or tenderness.  Lymphadenopathy:    He has no cervical adenopathy.  Neurological: He is alert and oriented to person, place, and time. He has normal strength. No cranial nerve deficit. Gait normal.  Skin: Skin is warm. No rash noted. No erythema.  Psychiatric: He has a normal mood and affect. Cognition and memory are normal.  Well groomed, good eye  contact.      ASSESSMENT AND PLAN:   [name] was seen today for hypertension.  Diagnoses and all orders for this visit:   Lab Results  Component Value Date   HGBA1C 6.5 09/03/2017   Lab Results  Component Value Date   CREATININE 0.91 09/03/2017   BUN 15 09/03/2017   NA 137 09/03/2017   K 3.9 09/03/2017   CL 100 09/03/2017   CO2 29 09/03/2017    Fatigue, unspecified type  We discussed possible etiologies: Systemic illness, immunologic,endocrinology,sleep disorder, psychiatric/psychologic, infectious,medications side effects, and idiopathic. HTN and OSA could be contributing to problem in his case. Examination today does not suggest a serious process. Healthy diet and regular physical activity may help.  Further recommendations will be given according to lab results.  Essential hypertension  Re-checked 779/39 Possible complications of elevated BP discussed. For now no changes in current management. DASH-low salt diet recommended. F/U in 3-4 weeks with PCP.  -     Basic metabolic panel -     Hemoglobin A1c  Tobacco use disorder  Adverse effects of tobacco use and benefits from smoking cessation discussed. Side effects of Chantix reviewed.  -     varenicline (CHANTIX CONTINUING MONTH PAK) 1 MG tablet; Take 1 tablet  (1 mg total) by mouth 2 (two) times daily. -     varenicline (CHANTIX STARTING MONTH PAK) 0.5 MG X 11 & 1 MG X 42 tablet; Take one 0.5 mg tablet by mouth once daily for 3 days, then increase to one 0.5 mg tablet twice daily for 4 days.  Hyperglycemia  Further recommendations will be given according to lab results. Wt loss recommended.  Sinus tachycardia  Reviewing records he has had HR > 100/min during OV's and on EKG's. Asymptomatic today and regular rhythm,so I do not think further work up is needed today other than those already ordered. Continue monitoring pulse. Instructed about warning signs. F/U in 4 weeks,he may benefit from BB.  Lab Results  Component Value Date   TSH 0.67 01/14/2017    -     Hemoglobin A1c      Return in about 4 weeks (around 10/01/2017) for HTN with PCP.     -[name] was advised to seek immediate medical attention if sudden worsening symptoms.      Aden Sek G. [place], MD  [facility]. [place] office.

## 2017-09-15 ENCOUNTER — Encounter: Payer: Self-pay | Admitting: Family Medicine

## 2017-09-15 ENCOUNTER — Ambulatory Visit: Payer: 59 | Admitting: Family Medicine

## 2017-09-15 VITALS — BP 120/80 | HR 110 | Temp 97.9°F | Wt 264.7 lb

## 2017-09-15 DIAGNOSIS — E119 Type 2 diabetes mellitus without complications: Secondary | ICD-10-CM

## 2017-09-15 DIAGNOSIS — E669 Obesity, unspecified: Secondary | ICD-10-CM | POA: Diagnosis not present

## 2017-09-15 DIAGNOSIS — G4733 Obstructive sleep apnea (adult) (pediatric): Secondary | ICD-10-CM | POA: Diagnosis not present

## 2017-09-15 DIAGNOSIS — I10 Essential (primary) hypertension: Secondary | ICD-10-CM | POA: Diagnosis not present

## 2017-09-15 NOTE — Progress Notes (Signed)
Subjective:     Patient ID: Gary Mora, male   DOB: Aug 10, 1966, 52 y.o.   MRN: [id number]  HPI Patient seen for medical follow-up. He's had some recent elevated blood pressures at home upper 140/100. He was seen here week ago with some increased fatigue. Blood work at that point revealed A1c 6.5%. Other labs unremarkable. Very poor compliance with diet over the holidays. Eating lots of carbs and sugar. He also drinks about 3-4 sugar containing beverages a day between sodas and sweetened tea.  No alcohol use. He takes amlodipine for hypertension. Blood pressures generally been well controlled here. No headaches. No dizziness.  Past Medical History:  Diagnosis Date  . Allergy   . Chicken pox   . Chronic kidney disease    kidney stone  . Headache(784.0)    tension after using the computer  . Hypertension   . Sleep apnea    Had surgery to correct no cpapa used  . Substance abuse [facility])    Past Surgical History:  Procedure Laterality Date  . MOUTH SURGERY    . TONSILLECTOMY  2006  . UVULOPALATOPHARYNGOPLASTY  2005    reports that he has been smoking cigarettes.  He has a 30.00 pack-year smoking history. he has never used smokeless tobacco. He reports that he does not drink alcohol or use drugs. family history includes Arthritis in his father and mother; Hypothyroidism in his sister. Allergies  Allergen Reactions  . Asa Buff (Mag [Buffered Aspirin]     hives     Review of Systems  Constitutional: Positive for fatigue. Negative for unexpected weight change.  Eyes: Negative for visual disturbance.  Respiratory: Negative for cough, chest tightness and shortness of breath.   Cardiovascular: Negative for chest pain, palpitations and leg swelling.  Gastrointestinal: Negative for abdominal pain.  Genitourinary: Negative for dysuria.  Neurological: Negative for dizziness, syncope, weakness, light-headedness and headaches.       Objective:   Physical Exam  Constitutional: He is  oriented to person, place, and time. He appears well-developed and well-nourished.  HENT:  Right Ear: External ear normal.  Left Ear: External ear normal.  Mouth/Throat: Oropharynx is clear and moist.  Eyes: Pupils are equal, round, and reactive to light.  Neck: Neck supple. No thyromegaly present.  Cardiovascular: Normal rate and regular rhythm.  Pulmonary/Chest: Effort normal and breath sounds normal. No respiratory distress. He has no wheezes. He has no rales.  Musculoskeletal: He exhibits no edema.  Neurological: He is alert and oriented to person, place, and time.       Assessment:     #1 hypertension. Well controlled today but recent elevated home readings intermittently  #2 hyperglycemia. Recent A1c 6.5%.  Has been diagnosed previously as "prediabetes "but really probably has type 2 diabetes with A1c recently 6.5% and also recent fasting blood sugar 150  #3 obesity with poor compliance with diet    Plan:     -Discussed options. We discussed whether to start medication such as metformin and patient prefers giving this 3 month trial of lifestyle modification with reduction in sugars and starches and weight loss and recheck A1c -Continue amlodipine. We explained that his blood pressure will likely improve with weight loss as well -Offered further diabetic teaching but he declines at this time  Eulas Post MD [facility] at [facility]

## 2017-09-15 NOTE — Patient Instructions (Signed)
Step up diet compliance with limiting sugars and starches. Let's plan on 3 month follow up.

## 2017-11-10 ENCOUNTER — Other Ambulatory Visit: Payer: Self-pay | Admitting: *Deleted

## 2017-11-10 DIAGNOSIS — F17200 Nicotine dependence, unspecified, uncomplicated: Secondary | ICD-10-CM

## 2017-11-10 MED ORDER — VARENICLINE TARTRATE 1 MG PO TABS: 1.0000 mg | ORAL_TABLET | Freq: Two times a day (BID) | ORAL | 0 refills | Status: DC

## 2017-11-10 NOTE — Telephone Encounter (Signed)
Rx sent to pharmacy   

## 2017-11-10 NOTE — Telephone Encounter (Signed)
Chantix 1mg  #168

## 2017-11-10 NOTE — Telephone Encounter (Signed)
What is the request?

## 2017-11-10 NOTE — Telephone Encounter (Signed)
Refill once OK. 

## 2017-12-07 ENCOUNTER — Other Ambulatory Visit: Payer: Self-pay | Admitting: Family Medicine

## 2017-12-07 DIAGNOSIS — F17200 Nicotine dependence, unspecified, uncomplicated: Secondary | ICD-10-CM

## 2017-12-15 ENCOUNTER — Ambulatory Visit: Payer: 59 | Admitting: Family Medicine

## 2017-12-15 ENCOUNTER — Encounter: Payer: Self-pay | Admitting: Family Medicine

## 2017-12-15 VITALS — BP 140/90 | HR 106 | Temp 98.1°F | Wt 272.4 lb

## 2017-12-15 DIAGNOSIS — N529 Male erectile dysfunction, unspecified: Secondary | ICD-10-CM

## 2017-12-15 DIAGNOSIS — E119 Type 2 diabetes mellitus without complications: Secondary | ICD-10-CM

## 2017-12-15 DIAGNOSIS — R635 Abnormal weight gain: Secondary | ICD-10-CM

## 2017-12-15 DIAGNOSIS — I10 Essential (primary) hypertension: Secondary | ICD-10-CM

## 2017-12-15 LAB — POCT GLYCOSYLATED HEMOGLOBIN (HGB A1C): Hemoglobin A1C: 6.5

## 2017-12-15 MED ORDER — SILDENAFIL CITRATE 100 MG PO TABS: 50.0000 mg | ORAL_TABLET | Freq: Every day | ORAL | 11 refills | Status: DC | PRN

## 2017-12-15 NOTE — Progress Notes (Signed)
Subjective:     Patient ID: Gary Mora, male   DOB: March 06, 1966, 52 y.o.   MRN: [id number]  HPI  Patient seen for follow-up regarding hypertension and type 2 diabetes. Last A1c 6.5%. He did quit smoking with assistance of Chantix.. Fortunately though he has gained 8 pounds. Poor compliance with diet. Still drinking about 3-4 sodas per day. Not monitoring blood sugars regularly. No polyuria or polydipsia. He declined metformin last visit. He did join a gym recently but is not using consistently  Hypertension treated with amlodipine. [place] medication and missed about 3 days. Did take 1 dose last night. No headaches. No chest pains. No dizziness. No peripheral edema issues.  History of erectile dysfunction. Requesting refill of sildenafil. No nitroglycerin use.  Past Medical History:  Diagnosis Date  . Allergy   . Chicken pox   . Chronic kidney disease    kidney stone  . Headache(784.0)    tension after using the computer  . Hypertension   . Sleep apnea    Had surgery to correct no cpapa used  . Substance abuse [facility])    Past Surgical History:  Procedure Laterality Date  . MOUTH SURGERY    . TONSILLECTOMY  2006  . UVULOPALATOPHARYNGOPLASTY  2005    reports that he quit smoking about 4 weeks ago. His smoking use included cigarettes. He has a 30.00 pack-year smoking history. He has never used smokeless tobacco. He reports that he does not drink alcohol or use drugs. family history includes Arthritis in his father and mother; Hypothyroidism in his sister. Allergies  Allergen Reactions  . Asa Buff (Mag [Buffered Aspirin]     hives    Review of Systems  Constitutional: Negative for fatigue and unexpected weight change.  Eyes: Negative for visual disturbance.  Respiratory: Negative for cough, chest tightness and shortness of breath.   Cardiovascular: Negative for chest pain, palpitations and leg swelling.  Endocrine: Negative for polydipsia  and polyuria.  Genitourinary: Negative for dysuria.  Neurological: Negative for dizziness, syncope, weakness, light-headedness and headaches.       Objective:   Physical Exam  Constitutional: He is oriented to person, place, and time. He appears well-developed and well-nourished.  HENT:  Right Ear: External ear normal.  Left Ear: External ear normal.  Mouth/Throat: Oropharynx is clear and moist.  Eyes: Pupils are equal, round, and reactive to light.  Neck: Neck supple. No thyromegaly present.  Cardiovascular: Normal rate and regular rhythm.  Pulmonary/Chest: Effort normal and breath sounds normal. No respiratory distress. He has no wheezes. He has no rales.  Musculoskeletal: He exhibits no edema.  Neurological: He is alert and oriented to person, place, and time.  Skin:  He has some hyperpigmentation both feet and lower legs consistent with hemosiderin deposits       Assessment:     #1 type 2 diabetes stable with A1c 6.5%. Poor compliance with diet and very sedentary lifestyle  #2 hypertension. Slightly up today but patient was off medication for a few days  #3 erectile dysfunction  #4 recent weight gain probably related to smoking cessation    Plan:     -He is challenged to lose some weight. We have again offered diabetes education but he declines. -Establish more consistent exercise -Monitor blood pressure closely and if blood pressure consistently greater than 140/90 be in touch. Will plan reassessment in 3 months. If blood pressure not further to goal at that point consider  ACE inhibitor or angiotensin receptor blocker in addition to his amlodipine  Eulas Post MD [facility] at [facility]

## 2017-12-15 NOTE — Patient Instructions (Addendum)
Diabetes Mellitus and Nutrition When you have diabetes (diabetes mellitus), it is very important to have healthy eating habits because your blood sugar (glucose) levels are greatly affected by what you eat and drink. Eating healthy foods in the appropriate amounts, at about the same times every day, can help you:  Control your blood glucose.  Lower your risk of heart disease.  Improve your blood pressure.  Reach or maintain a healthy weight.  Every person with diabetes is different, and each person has different needs for a meal plan. Your health care provider may recommend that you work with a diet and nutrition specialist (dietitian) to make a meal plan that is best for you. Your meal plan may vary depending on factors such as:  The calories you need.  The medicines you take.  Your weight.  Your blood glucose, blood pressure, and cholesterol levels.  Your activity level.  Other health conditions you have, such as heart or kidney disease.  How do carbohydrates affect me? Carbohydrates affect your blood glucose level more than any other type of food. Eating carbohydrates naturally increases the amount of glucose in your blood. Carbohydrate counting is a method for keeping track of how many carbohydrates you eat. Counting carbohydrates is important to keep your blood glucose at a healthy level, especially if you use insulin or take certain oral diabetes medicines. It is important to know how many carbohydrates you can safely have in each meal. This is different for every person. Your dietitian can help you calculate how many carbohydrates you should have at each meal and for snack. Foods that contain carbohydrates include:  Bread, cereal, rice, pasta, and crackers.  Potatoes and corn.  Peas, beans, and lentils.  Milk and yogurt.  Fruit and juice.  Desserts, such as cakes, cookies, ice cream, and candy.  How does alcohol affect me? Alcohol can cause a sudden decrease in blood  glucose (hypoglycemia), especially if you use insulin or take certain oral diabetes medicines. Hypoglycemia can be a life-threatening condition. Symptoms of hypoglycemia (sleepiness, dizziness, and confusion) are similar to symptoms of having too much alcohol. If your health care provider says that alcohol is safe for you, follow these guidelines:  Limit alcohol intake to no more than 1 drink per day for nonpregnant women and 2 drinks per day for men. One drink equals 12 oz of beer, 5 oz of wine, or 1 oz of hard liquor.  Do not drink on an empty stomach.  Keep yourself hydrated with water, diet soda, or unsweetened iced tea.  Keep in mind that regular soda, juice, and other mixers may contain a lot of sugar and must be counted as carbohydrates.  What are tips for following this plan? Reading food labels  Start by checking the serving size on the label. The amount of calories, carbohydrates, fats, and other nutrients listed on the label are based on one serving of the food. Many foods contain more than one serving per package.  Check the total grams (g) of carbohydrates in one serving. You can calculate the number of servings of carbohydrates in one serving by dividing the total carbohydrates by 15. For example, if a food has 30 g of total carbohydrates, it would be equal to 2 servings of carbohydrates.  Check the number of grams (g) of saturated and trans fats in one serving. Choose foods that have low or no amount of these fats.  Check the number of milligrams (mg) of sodium in one serving. Most people   should limit total sodium intake to less than 2,300 mg per day.  Always check the nutrition information of foods labeled as "low-fat" or "nonfat". These foods may be higher in added sugar or refined carbohydrates and should be avoided.  Talk to your dietitian to identify your daily goals for nutrients listed on the label. Shopping  Avoid buying canned, premade, or processed foods. These  foods tend to be high in fat, sodium, and added sugar.  Shop around the outside edge of the grocery store. This includes fresh fruits and vegetables, bulk grains, fresh meats, and fresh dairy. Cooking  Use low-heat cooking methods, such as baking, instead of high-heat cooking methods like deep frying.  Cook using healthy oils, such as olive, canola, or sunflower oil.  Avoid cooking with butter, cream, or high-fat meats. Meal planning  Eat meals and snacks regularly, preferably at the same times every day. Avoid going long periods of time without eating.  Eat foods high in fiber, such as fresh fruits, vegetables, beans, and whole grains. Talk to your dietitian about how many servings of carbohydrates you can eat at each meal.  Eat 4-6 ounces of lean protein each day, such as lean meat, chicken, fish, eggs, or tofu. 1 ounce is equal to 1 ounce of meat, chicken, or fish, 1 egg, or 1/4 cup of tofu.  Eat some foods each day that contain healthy fats, such as avocado, nuts, seeds, and fish. Lifestyle   Check your blood glucose regularly.  Exercise at least 30 minutes 5 or more days each week, or as told by your health care provider.  Take medicines as told by your health care provider.  Do not use any products that contain nicotine or tobacco, such as cigarettes and e-cigarettes. If you need help quitting, ask your health care provider.  Work with a Social worker or diabetes educator to identify strategies to manage stress and any emotional and social challenges. What are some questions to ask my health care provider?  Do I need to meet with a diabetes educator?  Do I need to meet with a dietitian?  What number can I call if I have questions?  When are the best times to check my blood glucose? Where to find more information:  American Diabetes Association: diabetes.org/food-and-fitness/food  Academy of Nutrition and Dietetics:  PokerClues.dk  [employer] of Diabetes and Digestive and Kidney Diseases (NIH): ContactWire.be Summary  A healthy meal plan will help you control your blood glucose and maintain a healthy lifestyle.  Working with a diet and nutrition specialist (dietitian) can help you make a meal plan that is best for you.  Keep in mind that carbohydrates and alcohol have immediate effects on your blood glucose levels. It is important to count carbohydrates and to use alcohol carefully. This information is not intended to replace advice given to you by your health care provider. Make sure you discuss any questions you have with your health care provider. Document Released: 05/02/2005 Document Revised: 09/09/2016 Document Reviewed: 09/09/2016 Elsevier Interactive Patient Education  2018 [place] some weight.  Establish more consistent exercise.

## 2018-01-07 ENCOUNTER — Other Ambulatory Visit: Payer: Self-pay | Admitting: Family Medicine

## 2018-01-07 DIAGNOSIS — F17200 Nicotine dependence, unspecified, uncomplicated: Secondary | ICD-10-CM

## 2018-02-03 ENCOUNTER — Other Ambulatory Visit: Payer: Self-pay | Admitting: Family Medicine

## 2018-02-03 DIAGNOSIS — F17200 Nicotine dependence, unspecified, uncomplicated: Secondary | ICD-10-CM

## 2018-02-11 ENCOUNTER — Other Ambulatory Visit: Payer: Self-pay | Admitting: Family Medicine

## 2018-03-04 ENCOUNTER — Other Ambulatory Visit: Payer: Self-pay | Admitting: Family Medicine

## 2018-03-04 DIAGNOSIS — F17200 Nicotine dependence, unspecified, uncomplicated: Secondary | ICD-10-CM

## 2018-03-16 ENCOUNTER — Ambulatory Visit: Payer: 59 | Admitting: Family Medicine

## 2018-03-16 DIAGNOSIS — Z0289 Encounter for other administrative examinations: Secondary | ICD-10-CM

## 2018-08-05 ENCOUNTER — Other Ambulatory Visit: Payer: Self-pay | Admitting: Family Medicine

## 2018-11-08 ENCOUNTER — Other Ambulatory Visit: Payer: Self-pay | Admitting: Family Medicine

## 2019-02-05 ENCOUNTER — Other Ambulatory Visit: Payer: Self-pay | Admitting: Family Medicine

## 2019-02-09 NOTE — Telephone Encounter (Signed)
Called patient and left a detailed voice message on both phone numbers to let him know that we have not seen him since 11/2017 and we need to set up a Doxy virtual visit for medication refill to be able to send in his medication refill.  OK for P[facility] to discuss and/or advise.  CRM Created.

## 2019-02-10 NOTE — Telephone Encounter (Signed)
Pt scheduled for virtual visit tomorrow morning, 8am. Please send email. Thanks!

## 2019-02-11 ENCOUNTER — Other Ambulatory Visit: Payer: Self-pay

## 2019-02-11 ENCOUNTER — Ambulatory Visit (INDEPENDENT_AMBULATORY_CARE_PROVIDER_SITE_OTHER): Payer: 59 | Admitting: Family Medicine

## 2019-02-11 DIAGNOSIS — E8881 Metabolic syndrome: Secondary | ICD-10-CM

## 2019-02-11 DIAGNOSIS — E1165 Type 2 diabetes mellitus with hyperglycemia: Secondary | ICD-10-CM | POA: Diagnosis not present

## 2019-02-11 DIAGNOSIS — I10 Essential (primary) hypertension: Secondary | ICD-10-CM

## 2019-02-11 NOTE — Progress Notes (Signed)
Patient ID: Gary Mora, male   DOB: 1966/05/12, 53 y.o.   MRN: [id number]  This visit type was conducted due to national recommendations for restrictions regarding the COVID-19 pandemic in an effort to limit this patient's exposure and mitigate transmission in our community.   Virtual Visit via Video Note  I connected with Dara Lords on 02/11/19 at  8:00 AM EDT by a video enabled telemedicine application and verified that I am speaking with the correct person using two identifiers.  Location patient: home Location provider:work or home office Persons participating in the virtual visit: patient, provider  I discussed the limitations of evaluation and management by telemedicine and the availability of in person appointments. The patient expressed understanding and agreed to proceed.   HPI: Patient has history of metabolic syndrome, obesity, type 2 diabetes, hypertension.  A1c last year 6.5%.  Does not take any medications for his blood sugars.  Not monitoring blood sugars.  No polyuria or polydipsia.  Weight stable.  He had gained some weight last year after quitting smoking.  He gets eye exams yearly and has eye exam next week.  Denies any neuropathy symptoms.  Last A1c was over a year ago  Hypertension treated with amlodipine.  Blood pressures have been stable by home readings.  Consistently less than 140/90.  Does not need refills yet.  He has history of mild hyperlipidemia with most recent LDL 110.  Currently not on statin.   ROS: See pertinent positives and negatives per HPI.  Past Medical History:  Diagnosis Date  . Allergy   . Chicken pox   . Chronic kidney disease    kidney stone  . Headache(784.0)    tension after using the computer  . Hypertension   . Sleep apnea    Had surgery to correct no cpapa used  . Substance abuse [facility])     Past Surgical History:  Procedure Laterality Date  . MOUTH SURGERY    . TONSILLECTOMY  2006  . UVULOPALATOPHARYNGOPLASTY  2005     Family History  Problem Relation Age of Onset  . Arthritis Mother   . Arthritis Father   . Hypothyroidism Sister   . Colon cancer Neg Hx   . Esophageal cancer Neg Hx   . Rectal cancer Neg Hx   . Stomach cancer Neg Hx     SOCIAL HX: Quit smoking over a year ago   Current Outpatient Medications:  .  amLODipine (NORVASC) 5 MG tablet, TAKE 1 TABLET BY MOUTH EVERY DAY, Disp: 90 tablet, Rfl: 0 .  Ascorbic Acid (VITAMIN C) 100 MG tablet, Take 100 mg by mouth daily.  , Disp: , Rfl:  .  CHANTIX CONTINUING MONTH PAK 1 MG tablet, TAKE 1 TABLET BY MOUTH TWICE A DAY, Disp: 56 tablet, Rfl: 0 .  desoximetasone (TOPICORT) 0.25 % cream, Apply 1 application topically 2 (two) times daily as needed., Disp: 30 g, Rfl: 2 .  loratadine (CLARITIN) 10 MG tablet, Take 10 mg by mouth daily., Disp: , Rfl:  .  multivitamin-iron-minerals-folic acid (CENTRUM) chewable tablet, Chew 1 tablet by mouth daily.  , Disp: , Rfl:  .  naproxen sodium (ANAPROX) 220 MG tablet, Take 220 mg by mouth 2 (two) times daily with a meal., Disp: , Rfl:  .  sildenafil (VIAGRA) 100 MG tablet, Take 0.5-1 tablets (50-100 mg total) by mouth daily as needed for erectile dysfunction., Disp: 6 tablet, Rfl: 11  Current Facility-Administered Medications:  .  0.9 %  sodium chloride infusion,  500 mL, Intravenous, Continuous, Pyrtle, Lajuan Lines, MD  EXAM:  VITALS per patient if applicable:  GENERAL: alert, oriented, appears well and in no acute distress  HEENT: atraumatic, conjunttiva clear, no obvious abnormalities on inspection of external nose and ears  NECK: normal movements of the head and neck  LUNGS: on inspection no signs of respiratory distress, breathing rate appears normal, no obvious gross SOB, gasping or wheezing  CV: no obvious cyanosis  MS: moves all visible extremities without noticeable abnormality  PSYCH/NEURO: pleasant and cooperative, no obvious depression or anxiety, speech and thought processing grossly  intact  ASSESSMENT AND PLAN:  Discussed the following assessment and plan:  #1 type 2 diabetes.  History of fair control.  Needs follow-up A1c  -Future lab order for G8B, basic metabolic panel, urine microalbumin for next Tuesday.  We will also check lipids  #2 hypertension.  Controlled by home readings  -Continue amlodipine 5 mg daily  #3 metabolic syndrome  -Encouraged to lose weight.  Low threshold to start statin with his diabetes history.  Will check lipids first.   I discussed the assessment and treatment plan with the patient. The patient was provided an opportunity to ask questions and all were answered. The patient agreed with the plan and demonstrated an understanding of the instructions.   The patient was advised to call back or seek an in-person evaluation if the symptoms worsen or if the condition fails to improve as anticipated.   Carolann Littler, MD

## 2019-02-16 ENCOUNTER — Other Ambulatory Visit (INDEPENDENT_AMBULATORY_CARE_PROVIDER_SITE_OTHER): Payer: 59

## 2019-02-16 ENCOUNTER — Other Ambulatory Visit: Payer: Self-pay

## 2019-02-16 DIAGNOSIS — E1165 Type 2 diabetes mellitus with hyperglycemia: Secondary | ICD-10-CM

## 2019-02-16 DIAGNOSIS — I10 Essential (primary) hypertension: Secondary | ICD-10-CM

## 2019-02-16 LAB — LIPID PANEL
Cholesterol: 166 mg/dL (ref 0–200)
HDL: 32 mg/dL — ABNORMAL LOW (ref >39.00)
NonHDL: 134
Total CHOL/HDL Ratio: 5
Triglycerides: 291 mg/dL — ABNORMAL HIGH (ref 0.0–149.0)
VLDL: 58.2 mg/dL — ABNORMAL HIGH (ref 0.0–40.0)

## 2019-02-16 LAB — HEPATIC FUNCTION PANEL
ALT: 22 U/L (ref 0–53)
AST: 23 U/L (ref 0–37)
Albumin: 4.2 g/dL (ref 3.5–5.2)
Alkaline Phosphatase: 67 U/L (ref 39–117)
Bilirubin, Direct: 0.1 mg/dL (ref 0.0–0.3)
Total Bilirubin: 0.8 mg/dL (ref 0.2–1.2)
Total Protein: 7.4 g/dL (ref 6.0–8.3)

## 2019-02-16 LAB — MICROALBUMIN / CREATININE URINE RATIO
Creatinine,U: 143.1 mg/dL
Microalb Creat Ratio: 2.6 mg/g (ref 0.0–30.0)
Microalb, Ur: 3.8 mg/dL — ABNORMAL HIGH (ref 0.0–1.9)

## 2019-02-16 LAB — LDL CHOLESTEROL, DIRECT: Direct LDL: 111 mg/dL

## 2019-02-16 LAB — BASIC METABOLIC PANEL
BUN: 15 mg/dL (ref 6–23)
CO2: 31 mEq/L (ref 19–32)
Calcium: 9.3 mg/dL (ref 8.4–10.5)
Chloride: 99 mEq/L (ref 96–112)
Creatinine, Ser: 1.02 mg/dL (ref 0.40–1.50)
GFR: 76.26 mL/min (ref >60.00)
Glucose, Bld: 139 mg/dL — ABNORMAL HIGH (ref 70–99)
Potassium: 4.3 mEq/L (ref 3.5–5.1)
Sodium: 138 mEq/L (ref 135–145)

## 2019-02-16 LAB — HEMOGLOBIN A1C: Hgb A1c MFr Bld: 7.9 % — ABNORMAL HIGH (ref 4.6–6.5)

## 2019-02-17 ENCOUNTER — Other Ambulatory Visit: Payer: Self-pay

## 2019-02-17 MED ORDER — METFORMIN HCL 500 MG PO TABS: 500.0000 mg | ORAL_TABLET | Freq: Two times a day (BID) | ORAL | 1 refills | Status: DC

## 2019-05-15 ENCOUNTER — Other Ambulatory Visit: Payer: Self-pay | Admitting: Family Medicine

## 2019-05-21 ENCOUNTER — Ambulatory Visit: Payer: 59 | Admitting: Family Medicine

## 2019-05-21 DIAGNOSIS — Z0289 Encounter for other administrative examinations: Secondary | ICD-10-CM

## 2019-06-21 ENCOUNTER — Encounter: Payer: Self-pay | Admitting: Family Medicine

## 2019-06-21 ENCOUNTER — Ambulatory Visit (INDEPENDENT_AMBULATORY_CARE_PROVIDER_SITE_OTHER): Payer: 59

## 2019-06-21 ENCOUNTER — Ambulatory Visit: Payer: 59 | Admitting: Family Medicine

## 2019-06-21 ENCOUNTER — Other Ambulatory Visit: Payer: Self-pay

## 2019-06-21 VITALS — BP 160/90 | HR 114 | Temp 97.4°F | Resp 16 | Ht 71.0 in | Wt 280.8 lb

## 2019-06-21 DIAGNOSIS — L97519 Non-pressure chronic ulcer of other part of right foot with unspecified severity: Secondary | ICD-10-CM

## 2019-06-21 DIAGNOSIS — I10 Essential (primary) hypertension: Secondary | ICD-10-CM | POA: Diagnosis not present

## 2019-06-21 DIAGNOSIS — R000 Tachycardia, unspecified: Secondary | ICD-10-CM | POA: Diagnosis not present

## 2019-06-21 NOTE — Progress Notes (Signed)
ACUTE VISIT   HPI:  Chief Complaint  Patient presents with  . Foot Pain    [name] is a 53 y.o. male with history of DM 2, hypertension, and OSA who is here today complaining of lesion on right foot. About a month ago while he was walking on the beach barefoot, he got a shell stuck in his foot. He cleaned area and did soak foot in epsom salt , "took a piece" out,not sure if he still has some shell pieces under skin. He is having pain intermittently affected area, severe, pain is "killing me." No limitation of ROM.  Pain is exacerbated by shoe wear when walking. He has not noted edema, erythema,or drainage from affected area.  He denies fever, chills, body aches, or unusual fatigue. He has not noted burning sensation, numbness, or tingling on affected area.  Not taking OTC medications. Problem is stable.  DM II on Metformin 500 mg twice daily, he does not check BS's at home.  Lab Results  Component Value Date   HGBA1C 7.9 (H) 02/16/2019   Denies abdominal pain, nausea,vomiting, polydipsia,polyuria, or polyphagia.  Found BP and HR to be elevated today. Hypertension, currently he is on amlodipine 5 mg daily. He states that he has not taking his antihypertensive medication today. Reporting "good" BPs, 140s/80s. He denies unusual headache, visual changes, chest pain, dyspnea, palpitation, decreased urine output, gross hematuria, or edema.  Review of Systems  Constitutional: Positive for fatigue (chronic). Negative for activity change, appetite change and fever.  HENT: Negative for mouth sores, nosebleeds and sore throat.   Eyes: Negative for pain and redness.  Respiratory: Negative for cough and wheezing.   Musculoskeletal: Negative for gait problem and myalgias.  Skin: Negative for pallor and wound.  Neurological: Negative for syncope, facial asymmetry, speech difficulty and weakness.  Rest see pertinent positives and negatives per HPI.   Current  Outpatient Medications on File Prior to Visit  Medication Sig Dispense Refill  . amLODipine (NORVASC) 5 MG tablet TAKE 1 TABLET BY MOUTH EVERY DAY 90 tablet 0  . Ascorbic Acid (VITAMIN C) 100 MG tablet Take 100 mg by mouth daily.      . CHANTIX CONTINUING MONTH PAK 1 MG tablet TAKE 1 TABLET BY MOUTH TWICE A DAY 56 tablet 0  . desoximetasone (TOPICORT) 0.25 % cream Apply 1 application topically 2 (two) times daily as needed. 30 g 2  . loratadine (CLARITIN) 10 MG tablet Take 10 mg by mouth daily.    . metFORMIN (GLUCOPHAGE) 500 MG tablet Take 1 tablet (500 mg total) by mouth 2 (two) times daily with a meal. 180 tablet 1  . multivitamin-iron-minerals-folic acid (CENTRUM) chewable tablet Chew 1 tablet by mouth daily.      . naproxen sodium (ANAPROX) 220 MG tablet Take 220 mg by mouth 2 (two) times daily with a meal.    . sildenafil (VIAGRA) 100 MG tablet Take 0.5-1 tablets (50-100 mg total) by mouth daily as needed for erectile dysfunction. 6 tablet 11   Current Facility-Administered Medications on File Prior to Visit  Medication Dose Route Frequency Provider Last Rate Last Dose  . 0.9 %  sodium chloride infusion  500 mL Intravenous Continuous Pyrtle, Lajuan Lines, MD         Past Medical History:  Diagnosis Date  . Allergy   . Chicken pox   . Chronic kidney disease    kidney stone  . Headache(784.0)    tension after using  the computer  . Hypertension   . Sleep apnea    Had surgery to correct no cpapa used  . Substance abuse ([facility])    Allergies  Allergen Reactions  . Asa Buff (Mag [Buffered Aspirin]     hives    Social History   Socioeconomic History  . Marital status: Divorced    Spouse name: Not on file  . Number of children: Not on file  . Years of education: Not on file  . Highest education level: Not on file  Occupational History  . Not on file  Social Needs  . Financial resource strain: Not on file  . Food insecurity    Worry: Not on file    Inability: Not on file  .  Transportation needs    Medical: Not on file    Non-medical: Not on file  Tobacco Use  . Smoking status: Former Smoker    Packs/day: 1.00    Years: 30.00    Pack years: 30.00    Types: Cigarettes    Quit date: 11/14/2017    Years since quitting: 1.6  . Smokeless tobacco: Never Used  Substance and Sexual Activity  . Alcohol use: No  . Drug use: No  . Sexual activity: Not on file  Lifestyle  . Physical activity    Days per week: Not on file    Minutes per session: Not on file  . Stress: Not on file  Relationships  . Social Herbalist on phone: Not on file    Gets together: Not on file    Attends religious service: Not on file    Active member of club or organization: Not on file    Attends meetings of clubs or organizations: Not on file    Relationship status: Not on file  Other Topics Concern  . Not on file  Social History Narrative  . Not on file    Vitals:   06/21/19 1525  BP: (!) 160/90  Pulse: (!) 114  Resp: 16  Temp: (!) 97.4 F (36.3 C)  SpO2: 96%   Body mass index is 39.16 kg/m.   Physical Exam  Nursing note and vitals reviewed. Constitutional: He is oriented to person, place, and time. He appears well-developed. No distress.  HENT:  Head: Normocephalic and atraumatic.  Mouth/Throat: Oropharynx is clear and moist and mucous membranes are normal.  Eyes: Pupils are equal, round, and reactive to light. Conjunctivae are normal.  Cardiovascular: Regular rhythm. Tachycardia present.  No murmur heard. Pulses:      Posterior tibial pulses are 2+ on the right side.  HR 104/min DP left present. I could not find right DP pulse. Normal capillary refill, no cyanosis.  Respiratory: Effort normal and breath sounds normal. No respiratory distress.  GI: Soft. He exhibits no mass. There is no hepatomegaly. There is no abdominal tenderness.  Musculoskeletal:        General: No edema.       Feet:  Lymphadenopathy:    He has no cervical adenopathy.   Neurological: He is alert and oriented to person, place, and time. He has normal strength. No cranial nerve deficit. Gait normal.  Skin: Skin is warm. No rash noted. No erythema.  Right foot: Lateral to 5th MTP joint with thick callus, small ulcer. No active drainage, no erythema,or edema. Mild tenderness upon palpation. I do not feel foreign bodies.  See picture.  Psychiatric: He has a normal mood and affect. Cognition and memory are normal.  Well groomed, good eye contact.      ASSESSMENT AND PLAN:   Mr. Decklyn was seen today for foot pain.  Diagnoses and all orders for this visit:  Essential hypertension He is reporting "good" BP readings at home but reported readings still above goal. We discussed possible complications of elevated BP. No changes in current management. Instructed about warning signs. Follow-up with PCP in 3 to 4 weeks with BP readings.  Sinus tachycardia Improved. Recommend monitoring HR at home. Clearly instructed about warning signs.  Ulcer of right foot, unspecified ulcer stage ([place]) Overlapped by callus. No signs of infection. Foot X ray ordered today: I do not appreciate foreign body. Adequate glucose controlled and appropriate foot care. Clearly instructed about warning signs. Recommend arranging appointment with podiatrist, office phone number given.  -     DG Foot Complete Right; Future  I am not able to find right DP pulse but no signs of acute ischemia,good refill. Instructed to monitor for the skin changes. ? PAD. Follow-up with PCP.  Return in about 4 weeks (around 07/19/2019) for HTN with PCP.Marland Kitchen    Chosen Geske G. [place], MD  [facility]. [place] office.

## 2019-06-21 NOTE — Patient Instructions (Signed)
A few things to remember from today's visit:   Essential hypertension  Callus of foot  Sinus tachycardia  Please arrange appointment with podiatrist, you need a referral. [facility] is a good option, phone number is [phone].  Please be sure medication list is accurate. If a new problem present, please set up appointment sooner than planned today.

## 2019-09-12 ENCOUNTER — Other Ambulatory Visit: Payer: Self-pay | Admitting: Family Medicine

## 2019-09-22 ENCOUNTER — Telehealth (INDEPENDENT_AMBULATORY_CARE_PROVIDER_SITE_OTHER): Payer: 59 | Admitting: Family Medicine

## 2019-09-22 ENCOUNTER — Other Ambulatory Visit: Payer: Self-pay

## 2019-09-22 ENCOUNTER — Encounter: Payer: Self-pay | Admitting: Family Medicine

## 2019-09-22 DIAGNOSIS — J029 Acute pharyngitis, unspecified: Secondary | ICD-10-CM

## 2019-09-22 DIAGNOSIS — R0981 Nasal congestion: Secondary | ICD-10-CM | POA: Diagnosis not present

## 2019-09-22 DIAGNOSIS — M791 Myalgia, unspecified site: Secondary | ICD-10-CM

## 2019-09-22 DIAGNOSIS — R5383 Other fatigue: Secondary | ICD-10-CM

## 2019-09-22 NOTE — Progress Notes (Signed)
This visit type was conducted due to national recommendations for restrictions regarding the COVID-19 pandemic in an effort to limit this patient's exposure and mitigate transmission in our community.   Virtual Visit via Video Note  I connected with Gary Mora on 09/22/19 at  5:15 PM EST by a video enabled telemedicine application and verified that I am speaking with the correct person using two identifiers.  Location patient: home Location provider:work or home office Persons participating in the virtual visit: patient, provider  I discussed the limitations of evaluation and management by telemedicine and the availability of in person appointments. The patient expressed understanding and agreed to proceed.   HPI:  Gary Mora noted onset yesterday of some increased fatigue, sore throat, nasal congestion.  No fever.  Has had some diffuse myalgias.  Only minimal cough.  No dyspnea.  No loss of taste or smell.  No diarrhea.  No known sick contacts.  Keeping down fluids.  He helps take care of his elderly parents and they are currently asymptomatic.   ROS: See pertinent positives and negatives per HPI.  Past Medical History:  Diagnosis Date  . Allergy   . Chicken pox   . Chronic kidney disease    kidney stone  . Headache(784.0)    tension after using the computer  . Hypertension   . Sleep apnea    Had surgery to correct no cpapa used  . Substance abuse [facility])     Past Surgical History:  Procedure Laterality Date  . MOUTH SURGERY    . TONSILLECTOMY  2006  . UVULOPALATOPHARYNGOPLASTY  2005    Family History  Problem Relation Age of Onset  . Arthritis Mother   . Arthritis Father   . Hypothyroidism Sister   . Colon cancer Neg Hx   . Esophageal cancer Neg Hx   . Rectal cancer Neg Hx   . Stomach cancer Neg Hx     SOCIAL HX: Former smoker   Current Outpatient Medications:  .  amLODipine (NORVASC) 5 MG tablet, TAKE 1 TABLET BY MOUTH EVERY DAY, Disp: 90 tablet, Rfl: 0 .   Ascorbic Acid (VITAMIN C) 100 MG tablet, Take 100 mg by mouth daily.  , Disp: , Rfl:  .  CHANTIX CONTINUING MONTH PAK 1 MG tablet, TAKE 1 TABLET BY MOUTH TWICE A DAY, Disp: 56 tablet, Rfl: 0 .  desoximetasone (TOPICORT) 0.25 % cream, Apply 1 application topically 2 (two) times daily as needed., Disp: 30 g, Rfl: 2 .  loratadine (CLARITIN) 10 MG tablet, Take 10 mg by mouth daily., Disp: , Rfl:  .  metFORMIN (GLUCOPHAGE) 500 MG tablet, TAKE 1 TABLET (500 MG TOTAL) BY MOUTH 2 (TWO) TIMES DAILY WITH A MEAL., Disp: 180 tablet, Rfl: 0 .  multivitamin-iron-minerals-folic acid (CENTRUM) chewable tablet, Chew 1 tablet by mouth daily.  , Disp: , Rfl:  .  naproxen sodium (ANAPROX) 220 MG tablet, Take 220 mg by mouth 2 (two) times daily with a meal., Disp: , Rfl:  .  sildenafil (VIAGRA) 100 MG tablet, Take 0.5-1 tablets (50-100 mg total) by mouth daily as needed for erectile dysfunction., Disp: 6 tablet, Rfl: 11  Current Facility-Administered Medications:  .  0.9 %  sodium chloride infusion, 500 mL, Intravenous, Continuous, Pyrtle, Lajuan Lines, MD  EXAM:  VITALS per patient if applicable:  GENERAL: alert, oriented, appears well and in no acute distress  HEENT: atraumatic, conjunttiva clear, no obvious abnormalities on inspection of external nose and ears  NECK: normal movements of the head  and neck  LUNGS: on inspection no signs of respiratory distress, breathing rate appears normal, no obvious gross SOB, gasping or wheezing  CV: no obvious cyanosis  MS: moves all visible extremities without noticeable abnormality  PSYCH/NEURO: pleasant and cooperative, no obvious depression or anxiety, speech and thought processing grossly intact  ASSESSMENT AND PLAN:  Discussed the following assessment and plan:  1 day history of fatigue, sore throat, nasal congestion, myalgias, mild cough.  Rule out COVID-19 infection.  He is in no distress currently  -Discussed Covid testing and he was given number to set this  up -Strict quarantine until further clarified and he is advised to stay away from his parents as much as possible until further clarified -Plenty of fluids and rest -Consider over-the-counter zinc and vitamin D - we discussed our respiratory clinic if he should worsen and need further evaluation in office     I discussed the assessment and treatment plan with the patient. The patient was provided an opportunity to ask questions and all were answered. The patient agreed with the plan and demonstrated an understanding of the instructions.   The patient was advised to call back or seek an in-person evaluation if the symptoms worsen or if the condition fails to improve as anticipated.   Carolann Littler, MD

## 2019-09-23 ENCOUNTER — Ambulatory Visit: Payer: 59 | Attending: Internal Medicine

## 2019-09-23 DIAGNOSIS — Z20822 Contact with and (suspected) exposure to covid-19: Secondary | ICD-10-CM

## 2019-09-24 ENCOUNTER — Telehealth: Payer: Self-pay | Admitting: Family Medicine

## 2019-09-24 LAB — NOVEL CORONAVIRUS, NAA: SARS-CoV-2, NAA: NOT DETECTED

## 2019-09-24 MED ORDER — DOXYCYCLINE HYCLATE 100 MG PO TABS: 100.0000 mg | ORAL_TABLET | Freq: Two times a day (BID) | ORAL | 0 refills | Status: AC

## 2019-09-24 NOTE — Telephone Encounter (Signed)
OK to send in Doxycycline 100 mg po bid for 7 days.

## 2019-09-24 NOTE — Telephone Encounter (Signed)
Rx sent. Left message on machine for patient. 

## 2019-09-24 NOTE — Telephone Encounter (Signed)
Pt was tested on 09/23/19 for Covid. Pt would like to know if he can have zpack or any antibiotic called in to the pharmacy until he gets his results. Pt believes he has a sinus infection. Thanks   [facility] [address] at [address] of [address].

## 2019-10-23 ENCOUNTER — Ambulatory Visit: Payer: 59 | Attending: Internal Medicine

## 2019-10-23 DIAGNOSIS — Z23 Encounter for immunization: Secondary | ICD-10-CM

## 2019-10-23 NOTE — Progress Notes (Signed)
   Covid-19 Vaccination Clinic  Name:  Gary Mora    MRN: [id number] DOB: 1965-12-06  10/23/2019  [name] was observed post Covid-19 immunization for 15 minutes without incident. He was provided with Vaccine Information Sheet and instruction to access the V-Safe system.   [name] was instructed to call 911 with any severe reactions post vaccine: Marland Kitchen Difficulty breathing  . Swelling of face and throat  . A fast heartbeat  . A bad rash all over body  . Dizziness and weakness   Immunizations Administered    Name Date Dose VIS Date Route   Pfizer COVID-19 Vaccine 10/23/2019 11:48 AM 0.3 mL 07/30/2019 Intramuscular   Manufacturer: [place]   Lot: [id number]   [place]: [id number]

## 2019-11-13 ENCOUNTER — Ambulatory Visit: Payer: 59 | Attending: Internal Medicine

## 2019-11-13 DIAGNOSIS — Z23 Encounter for immunization: Secondary | ICD-10-CM

## 2019-11-13 NOTE — Progress Notes (Signed)
   Covid-19 Vaccination Clinic  Name:  RICQUAN ELZA    MRN: [id number] DOB: 04/21/1966  11/13/2019  Mr. Bhatti was observed post Covid-19 immunization for 15 minutes without incident. He was provided with Vaccine Information Sheet and instruction to access the V-Safe system.   Mr. Bergsma was instructed to call 911 with any severe reactions post vaccine: Marland Kitchen Difficulty breathing  . Swelling of face and throat  . A fast heartbeat  . A bad rash all over body  . Dizziness and weakness   Immunizations Administered    Name Date Dose VIS Date Route   Pfizer COVID-19 Vaccine 11/13/2019  1:43 PM 0.3 mL 07/30/2019 Intramuscular   Manufacturer: [employer]   Lot: [id number]   [place]: [id number]

## 2019-11-24 ENCOUNTER — Telehealth: Payer: Self-pay | Admitting: Family Medicine

## 2019-11-24 ENCOUNTER — Ambulatory Visit: Payer: 59

## 2019-11-24 MED ORDER — SILDENAFIL CITRATE 100 MG PO TABS: 50.0000 mg | ORAL_TABLET | Freq: Every day | ORAL | 1 refills | Status: DC | PRN

## 2019-11-24 NOTE — Telephone Encounter (Signed)
Medication Refill: Sildenafil [facility] [address] FAX: [phone]  Pt has CPE coming up on 4/21

## 2019-11-24 NOTE — Telephone Encounter (Signed)
Refill sent in

## 2019-12-08 ENCOUNTER — Other Ambulatory Visit: Payer: Self-pay

## 2019-12-08 ENCOUNTER — Ambulatory Visit (INDEPENDENT_AMBULATORY_CARE_PROVIDER_SITE_OTHER): Payer: 59 | Admitting: Family Medicine

## 2019-12-08 ENCOUNTER — Encounter: Payer: Self-pay | Admitting: Family Medicine

## 2019-12-08 VITALS — BP 152/88 | HR 74 | Temp 97.6°F | Wt 274.7 lb

## 2019-12-08 DIAGNOSIS — Z23 Encounter for immunization: Secondary | ICD-10-CM | POA: Diagnosis not present

## 2019-12-08 DIAGNOSIS — Z125 Encounter for screening for malignant neoplasm of prostate: Secondary | ICD-10-CM | POA: Diagnosis not present

## 2019-12-08 DIAGNOSIS — E8881 Metabolic syndrome: Secondary | ICD-10-CM

## 2019-12-08 DIAGNOSIS — I10 Essential (primary) hypertension: Secondary | ICD-10-CM

## 2019-12-08 DIAGNOSIS — E669 Obesity, unspecified: Secondary | ICD-10-CM

## 2019-12-08 DIAGNOSIS — Z0000 Encounter for general adult medical examination without abnormal findings: Secondary | ICD-10-CM

## 2019-12-08 DIAGNOSIS — E1165 Type 2 diabetes mellitus with hyperglycemia: Secondary | ICD-10-CM

## 2019-12-08 DIAGNOSIS — E1140 Type 2 diabetes mellitus with diabetic neuropathy, unspecified: Secondary | ICD-10-CM | POA: Diagnosis not present

## 2019-12-08 LAB — HEPATIC FUNCTION PANEL
ALT: 20 U/L (ref 0–53)
AST: 21 U/L (ref 0–37)
Albumin: 4.1 g/dL (ref 3.5–5.2)
Alkaline Phosphatase: 62 U/L (ref 39–117)
Bilirubin, Direct: 0.1 mg/dL (ref 0.0–0.3)
Total Bilirubin: 0.7 mg/dL (ref 0.2–1.2)
Total Protein: 7.4 g/dL (ref 6.0–8.3)

## 2019-12-08 LAB — BASIC METABOLIC PANEL
BUN: 16 mg/dL (ref 6–23)
CO2: 26 mEq/L (ref 19–32)
Calcium: 9.3 mg/dL (ref 8.4–10.5)
Chloride: 100 mEq/L (ref 96–112)
Creatinine, Ser: 0.85 mg/dL (ref 0.40–1.50)
GFR: 93.83 mL/min (ref >60.00)
Glucose, Bld: 140 mg/dL — ABNORMAL HIGH (ref 70–99)
Potassium: 4 mEq/L (ref 3.5–5.1)
Sodium: 139 mEq/L (ref 135–145)

## 2019-12-08 LAB — MICROALBUMIN / CREATININE URINE RATIO
Creatinine,U: 157.4 mg/dL
Microalb Creat Ratio: 3.6 mg/g (ref 0.0–30.0)
Microalb, Ur: 5.6 mg/dL — ABNORMAL HIGH (ref 0.0–1.9)

## 2019-12-08 LAB — LIPID PANEL
Cholesterol: 176 mg/dL (ref 0–200)
HDL: 33.4 mg/dL — ABNORMAL LOW (ref >39.00)
NonHDL: 142.74
Total CHOL/HDL Ratio: 5
Triglycerides: 310 mg/dL — ABNORMAL HIGH (ref 0.0–149.0)
VLDL: 62 mg/dL — ABNORMAL HIGH (ref 0.0–40.0)

## 2019-12-08 LAB — PSA: PSA: 1.39 ng/mL (ref 0.10–4.00)

## 2019-12-08 LAB — TSH: TSH: 0.9 u[IU]/mL (ref 0.35–4.50)

## 2019-12-08 LAB — HEMOGLOBIN A1C: Hgb A1c MFr Bld: 7.9 % — ABNORMAL HIGH (ref 4.6–6.5)

## 2019-12-08 LAB — LDL CHOLESTEROL, DIRECT: Direct LDL: 115 mg/dL

## 2019-12-08 MED ORDER — LISINOPRIL 10 MG PO TABS: 10.0000 mg | ORAL_TABLET | Freq: Every day | ORAL | 3 refills | Status: DC

## 2019-12-08 NOTE — Addendum Note (Signed)
Addended by: Modena Morrow R on: 12/08/2019 09:03 AM   Modules accepted: Orders

## 2019-12-08 NOTE — Patient Instructions (Signed)

## 2019-12-08 NOTE — Progress Notes (Signed)
Subjective:     Patient ID: Gary Mora, male   DOB: Apr 08, 1966, 54 y.o.   MRN: [id number]  HPI   Gary Mora is seen today for physical.  He has history of obesity, hypertension, obstructive sleep apnea, poorly controlled type 2 diabetes, metabolic syndrome.  Very poor lifestyle.  He is sedentary with no exercise and very poor dietary compliance.  Generally drinks 2 regular sodas per day and he estimates almost 1/2 a gallon of sweet tea per day.  He realizes these are both detrimental to his blood sugars.  Last A1c was 7.9%.  Currently takes Metformin and also takes amlodipine for hypertension.  Health maintenance reviewed  -Tetanus due 2024 -Covid vaccine already given -Due for repeat colonoscopy June 2021 -No history of Pneumovax -Received Shingrix vaccine 2018 -Had eye exam in January -Last urine microalbumin screen was June 2020 and negative  Recently monitoring blood pressures or blood sugars.  He states he has lost about 7 pounds unintentionally recently.  Denies significant polyuria or polydipsia.  Past Medical History:  Diagnosis Date  . Allergy   . Chicken pox   . Chronic kidney disease    kidney stone  . Headache(784.0)    tension after using the computer  . Hypertension   . Sleep apnea    Had surgery to correct no cpapa used  . Substance abuse [facility])    Past Surgical History:  Procedure Laterality Date  . MOUTH SURGERY    . TONSILLECTOMY  2006  . UVULOPALATOPHARYNGOPLASTY  2005    reports that he quit smoking about 2 years ago. His smoking use included cigarettes. He has a 30.00 pack-year smoking history. He has never used smokeless tobacco. He reports that he does not drink alcohol or use drugs. family history includes Arthritis in his father and mother; Hypothyroidism in his sister. Allergies  Allergen Reactions  . Asa Buff (Mag [Buffered Aspirin]     hives     Review of Systems  Constitutional: Positive for unexpected weight change. Negative for activity  change, appetite change, fatigue and fever.  HENT: Negative for congestion, ear pain and trouble swallowing.   Eyes: Negative for pain and visual disturbance.  Respiratory: Negative for cough, shortness of breath and wheezing.   Cardiovascular: Negative for chest pain and palpitations.  Gastrointestinal: Negative for abdominal distention, abdominal pain, blood in stool, constipation, diarrhea, nausea, rectal pain and vomiting.  Endocrine: Negative for polydipsia and polyuria.  Genitourinary: Negative for dysuria, hematuria and testicular pain.  Musculoskeletal: Negative for arthralgias and joint swelling.  Skin: Negative for rash.  Neurological: Negative for dizziness, syncope, weakness and headaches.  Hematological: Negative for adenopathy.  Psychiatric/Behavioral: Negative for confusion and dysphoric mood.       Objective:   Physical Exam Vitals reviewed.  Constitutional:      General: He is not in acute distress.    Appearance: Normal appearance. He is well-developed.  HENT:     Head: Normocephalic and atraumatic.     Right Ear: External ear normal.     Left Ear: External ear normal.  Eyes:     Conjunctiva/sclera: Conjunctivae normal.     Pupils: Pupils are equal, round, and reactive to light.  Neck:     Thyroid: No thyromegaly.  Cardiovascular:     Rate and Rhythm: Normal rate and regular rhythm.     Heart sounds: Normal heart sounds. No murmur.  Pulmonary:     Effort: Pulmonary effort is normal. No respiratory distress.  Breath sounds: Normal breath sounds. No wheezing or rales.  Abdominal:     General: Bowel sounds are normal. There is no distension.     Palpations: Abdomen is soft. There is no mass.     Tenderness: There is no abdominal tenderness. There is no guarding or rebound.  Musculoskeletal:     Cervical back: Normal range of motion and neck supple.  Lymphadenopathy:     Cervical: No cervical adenopathy.  Skin:    Findings: No rash.     Comments: He has  some mild impairment with monofilament testing right foot greater than left.  He has moderate sized callus right great toe medially.  No underlying ulceration.  No foot lesions noted.  Good distal pulses.  Neurological:     Mental Status: He is alert and oriented to person, place, and time.     Cranial Nerves: No cranial nerve deficit.     Deep Tendon Reflexes: Reflexes normal.        Assessment:     #1 physical exam.  Several health maintenance issues addressed as below  #2 type 2 diabetes with evidence for peripheral neuropathy with recent poor control and multiple lifestyle factors contributing to poor control including diet and lack of exercise.  We had a long talk today about the potential consequences of poorly controlled diabetes with both microvascular and macrovascular risks.  #3 hypertension suboptimally controlled  #4 history of mild dyslipidemia currently not treated with statin    Plan:     -Patient will make sure he has colonoscopy set up for later this summer  -Pneumovax given  -Continue with annual flu vaccine  -Discussed importance of lifestyle modification with initiating some regular exercise and making some serious dietary changes.  He needs to give up colas and sweetened beverages as a first step.  Also strongly advise setting up to see certified diabetes educator he agrees.  -Start lisinopril 10 mg p.o. daily and set up follow-up for office visit within 2 months to reassess.  Goal blood pressure less than 130/80  -We will strongly advise statin but have decided to get back labs first.  -We will likely recommend either SGLT2 or GLP-1 medication depending on A1c results  -Diabetic foot care discussed  -Obtain screening labs.  Include PSA.  Also rechecking A1c and urine microalbumin screen  Eulas Post MD [facility]

## 2019-12-09 LAB — CBC WITH DIFFERENTIAL/PLATELET
Basophils Absolute: 0.1 10*3/uL (ref 0.0–0.1)
Basophils Relative: 1.2 % (ref 0.0–3.0)
Eosinophils Absolute: 0.3 10*3/uL (ref 0.0–0.7)
Eosinophils Relative: 4 % (ref 0.0–5.0)
HCT: 50.2 % (ref 39.0–52.0)
Hemoglobin: 16.5 g/dL (ref 13.0–17.0)
Lymphocytes Relative: 27.6 % (ref 12.0–46.0)
Lymphs Abs: 2.4 10*3/uL (ref 0.7–4.0)
MCHC: 32.9 g/dL (ref 30.0–36.0)
MCV: 90.8 fl (ref 78.0–100.0)
Monocytes Absolute: 0.6 10*3/uL (ref 0.1–1.0)
Monocytes Relative: 6.9 % (ref 3.0–12.0)
Neutro Abs: 5.2 10*3/uL (ref 1.4–7.7)
Neutrophils Relative %: 60.3 % (ref 43.0–77.0)
Platelets: 357 10*3/uL (ref 150.0–400.0)
RBC: 5.53 Mil/uL (ref 4.22–5.81)
RDW: 14 % (ref 11.5–15.5)
WBC: 8.7 10*3/uL (ref 4.0–10.5)

## 2019-12-10 ENCOUNTER — Telehealth: Payer: Self-pay | Admitting: Family Medicine

## 2019-12-10 NOTE — Telephone Encounter (Signed)
Tried calling pt back

## 2019-12-10 NOTE — Telephone Encounter (Signed)
Pt returned call to go over test results, would like a call back.  [phone]

## 2019-12-11 ENCOUNTER — Other Ambulatory Visit: Payer: Self-pay | Admitting: Family Medicine

## 2019-12-13 ENCOUNTER — Other Ambulatory Visit: Payer: Self-pay

## 2019-12-13 MED ORDER — ATORVASTATIN CALCIUM 10 MG PO TABS
10.0000 mg | ORAL_TABLET | Freq: Every day | ORAL | 1 refills | Status: DC
Start: 2019-12-13 — End: 2020-06-07

## 2019-12-13 MED ORDER — JARDIANCE 10 MG PO TABS
10.0000 mg | ORAL_TABLET | Freq: Every day | ORAL | 1 refills | Status: DC
Start: 2019-12-13 — End: 2020-06-19

## 2019-12-13 NOTE — Telephone Encounter (Signed)
See result note this has been handled

## 2019-12-23 ENCOUNTER — Other Ambulatory Visit: Payer: Self-pay | Admitting: Family Medicine

## 2019-12-31 ENCOUNTER — Encounter: Payer: 59 | Attending: Family Medicine | Admitting: Dietician

## 2019-12-31 ENCOUNTER — Other Ambulatory Visit: Payer: Self-pay

## 2019-12-31 ENCOUNTER — Encounter: Payer: Self-pay | Admitting: Dietician

## 2019-12-31 DIAGNOSIS — E1140 Type 2 diabetes mellitus with diabetic neuropathy, unspecified: Secondary | ICD-10-CM | POA: Diagnosis present

## 2019-12-31 DIAGNOSIS — E1165 Type 2 diabetes mellitus with hyperglycemia: Secondary | ICD-10-CM | POA: Diagnosis present

## 2019-12-31 NOTE — Progress Notes (Signed)
Diabetes Self-Management Education  Visit Type: First/Initial  Appt. Start Time: 8:20am   Appt. End Time: 9:15am  12/31/2019  Gary Mora, identified by name and date of birth, is a 54 y.o. male with a diagnosis of Diabetes: Type 2.   ASSESSMENT  Weight 269 lb 9.6 oz (122.3 kg). Body mass index is 37.6 kg/m.   Patient states he has had some nutrition education before, but not diabetes-specific. States he generally knows what he needs to do regarding food/beverage intake to help control diabetes but is trying to find motivation to do it, primarily cutting out sweet tea and sodas. Typical meal pattern is 3 meals per day plus ~2 snacks. Drinks ~3 bottles of water per day, plus a few sodas and/or sweet tea. May eat out some throughout the week. Works from home so is sedentary throughout the day, states he doesn't get out much or get physical activity throughout the week.   Diabetes Self-Management Education - 12/31/19 0826      Visit Information   Visit Type  First/Initial      Initial Visit   Diabetes Type  Type 2    Are you currently following a meal plan?  No    Are you taking your medications as prescribed?  Yes    Date Diagnosed  June 2020      Health Coping   How would you rate your overall health?  Fair      Psychosocial Assessment   Patient Belief/Attitude about Diabetes  Motivated to manage diabetes    Self-care barriers  None    Self-management support  Doctor's office    Other persons present  Patient    Patient Concerns  Nutrition/Meal planning    Special Needs  None    Preferred Learning Style  No preference indicated    Learning Readiness  Ready    How often do you need to have someone help you when you read instructions, pamphlets, or other written materials from your doctor or pharmacy?  1 - Never    What is the last grade level you completed in school?  some college      Complications   Last HgB A1C per patient/outside source  7.9 %   12/08/19   How  often do you check your blood sugar?  0 times/day (not testing)    Have you had a dilated eye exam in the past 12 months?  Yes    Have you had a dental exam in the past 12 months?  Yes    Are you checking your feet?  Yes    How many days per week are you checking your feet?  1      Dietary Intake   Breakfast  cereal + soda   or pancakes, or egg sandwich   Snack (morning)  crackers   or M&M's   Lunch  slice of pizza   or sandwich   Dinner  steak + mashed potatoes + vegetables   chicken cordon bleu   Snack (evening)  cake + Pepsi    Beverage(s)  Pepsi, Mtn Dew, sweet tea, 3-4 bottles water      Exercise   Exercise Type  ADL's    How many days per week to you exercise?  0    How many minutes per day do you exercise?  0    Total minutes per week of exercise  0      Patient Education   Previous Diabetes Education  No  Disease state   Definition of diabetes, type 1 and 2, and the diagnosis of diabetes;Factors that contribute to the development of diabetes;Explored patient's options for treatment of their diabetes    Nutrition management   Role of diet in the treatment of diabetes and the relationship between the three main macronutrients and blood glucose level;Carbohydrate counting;Information on hints to eating out and maintain blood glucose control.;Meal options for control of blood glucose level and chronic complications.    Physical activity and exercise   Role of exercise on diabetes management, blood pressure control and cardiac health.    Medications  Reviewed medication adjustment guidelines for hyperglycemia and sick days.    Monitoring  Yearly dilated eye exam;Daily foot exams    Acute complications  Taught treatment of hypoglycemia - the 15 rule.;Discussed and identified patients' treatment of hyperglycemia.;Covered sick day management with medication and food.    Chronic complications  Relationship between chronic complications and blood glucose control;Retinopathy and reason  for yearly dilated eye exams    Psychosocial adjustment  Role of stress on diabetes;Helped patient identify a support system for diabetes management;Identified and addressed patients feelings and concerns about diabetes      Individualized Goals (developed by patient)   Nutrition  Follow meal plan discussed   2-4 carb choices per meal, <2 carb choices per snack, protein with all meals/snacks   Medications  take my medication as prescribed      Outcomes   Expected Outcomes  Demonstrated interest in learning. Expect positive outcomes    Future DMSE  2 months       Individualized Plan for Diabetes Self-Management Training:   Learning Objective:  Patient will have a greater understanding of diabetes self-management. Patient education plan is to attend individual and/or group sessions per assessed needs and concerns.   Plan:  Patient Instructions  It was nice meeting you today!  Remember the following goals:   Monitor your intake of carbohydrate foods and spread them throughout the day  2-4 carb choices (30-60 grams of carbohydrates) per meal   <2 carb choices (0-30 grams of carbohydrates) per snack  Include a source of protein with all meals and snacks  Increase fluid intake, specifically sugar-free fluids, especially water!   Expected Outcomes:  Demonstrated interest in learning. Expect positive outcomes  Education material provided: ADA - How to Thrive: A Guide for Your Journey with Diabetes, Meal plan card, My Plate, Snack sheet and Carbohydrate counting sheet  If problems or questions, patient to contact team via: Phone and Email  Future DSME appointment: 2 months  Patient is to contact [facility] to schedule next follow up appointment.

## 2019-12-31 NOTE — Patient Instructions (Signed)
It was nice meeting you today!  Remember the following goals:   Monitor your intake of carbohydrate foods and spread them throughout the day  2-4 carb choices (30-60 grams of carbohydrates) per meal   <2 carb choices (0-30 grams of carbohydrates) per snack  Include a source of protein with all meals and snacks  Increase fluid intake, specifically sugar-free fluids, especially water!

## 2020-02-16 ENCOUNTER — Other Ambulatory Visit: Payer: Self-pay

## 2020-02-16 ENCOUNTER — Ambulatory Visit: Payer: 59 | Admitting: Family Medicine

## 2020-02-16 ENCOUNTER — Encounter: Payer: Self-pay | Admitting: Family Medicine

## 2020-02-16 VITALS — BP 136/80 | HR 97 | Temp 97.6°F | Wt 258.5 lb

## 2020-02-16 DIAGNOSIS — K5900 Constipation, unspecified: Secondary | ICD-10-CM

## 2020-02-16 DIAGNOSIS — R1012 Left upper quadrant pain: Secondary | ICD-10-CM | POA: Diagnosis not present

## 2020-02-16 DIAGNOSIS — R55 Syncope and collapse: Secondary | ICD-10-CM

## 2020-02-16 NOTE — Progress Notes (Signed)
Established Patient Office Visit  Subjective:  Patient ID: Gary Mora, male    DOB: 1966/03/17  Age: 54 y.o. MRN: [id number]  CC:  Chief Complaint  Patient presents with  . Abdominal Pain    had abdominal pain in upper left quadrant and constipation passed out of toilet while trying to go last week     HPI Gary Mora presents for several weeks of increased constipation symptoms.  He states he has frequently small caliber pellet-like stools and has to strain considerably.  Couple weeks ago was on the toilet straining and actually had a syncopal episode.  Had none since then.  He did not have any suspicion of seizure.  No urine incontinence.    He had episode couple days ago some left upper quadrant abdominal pain.  He had colonoscopy June 2018 which showed diverticula throughout the colon.  He is having bowel movements this week but still having to strain.  He tried over-the-counter Dulcolax without much improvement.  He is trying to drink more fluids.  Does not take any anticholinergic medications.  He states he has lost some weight recently and trying to do so.    Past Medical History:  Diagnosis Date  . Allergy   . Chicken pox   . Chronic kidney disease    kidney stone  . Headache(784.0)    tension after using the computer  . Hypertension   . Sleep apnea    Had surgery to correct no cpapa used  . Substance abuse [facility])     Past Surgical History:  Procedure Laterality Date  . MOUTH SURGERY    . TONSILLECTOMY  2006  . UVULOPALATOPHARYNGOPLASTY  2005    Family History  Problem Relation Age of Onset  . Arthritis Mother   . Arthritis Father   . Hypothyroidism Sister   . Colon cancer Neg Hx   . Esophageal cancer Neg Hx   . Rectal cancer Neg Hx   . Stomach cancer Neg Hx     Social History   Socioeconomic History  . Marital status: Divorced    Spouse name: Not on file  . Number of children: Not on file  . Years of education: Not on file  . Highest education  level: Not on file  Occupational History  . Not on file  Tobacco Use  . Smoking status: Former Smoker    Packs/day: 1.00    Years: 30.00    Pack years: 30.00    Types: Cigarettes    Quit date: 11/14/2017    Years since quitting: 2.2  . Smokeless tobacco: Never Used  Vaping Use  . Vaping Use: Never used  Substance and Sexual Activity  . Alcohol use: No  . Drug use: No  . Sexual activity: Not on file  Other Topics Concern  . Not on file  Social History Narrative  . Not on file   Social Determinants of Health   Financial Resource Strain:   . Difficulty of Paying Living Expenses:   Food Insecurity:   . Worried About Charity fundraiser in the Last Year:   . Arboriculturist in the Last Year:   Transportation Needs:   . Film/video editor (Medical):   Marland Kitchen Lack of Transportation (Non-Medical):   Physical Activity:   . Days of Exercise per Week:   . Minutes of Exercise per Session:   Stress:   . Feeling of Stress :   Social Connections:   . Frequency  of Communication with Friends and Family:   . Frequency of Social Gatherings with Friends and Family:   . Attends Religious Services:   . Active Member of Clubs or Organizations:   . Attends Archivist Meetings:   Marland Kitchen Marital Status:   Intimate Partner Violence:   . Fear of Current or Ex-Partner:   . Emotionally Abused:   Marland Kitchen Physically Abused:   . Sexually Abused:     Outpatient Medications Prior to Visit  Medication Sig Dispense Refill  . amLODipine (NORVASC) 5 MG tablet TAKE 1 TABLET BY MOUTH EVERY DAY 90 tablet 0  . Ascorbic Acid (VITAMIN C) 100 MG tablet Take 100 mg by mouth daily.      Marland Kitchen atorvastatin (LIPITOR) 10 MG tablet Take 1 tablet (10 mg total) by mouth daily. 90 tablet 1  . CHANTIX CONTINUING MONTH PAK 1 MG tablet TAKE 1 TABLET BY MOUTH TWICE A DAY 56 tablet 0  . desoximetasone (TOPICORT) 0.25 % cream Apply 1 application topically 2 (two) times daily as needed. 30 g 2  . empagliflozin (JARDIANCE) 10  MG TABS tablet Take 10 mg by mouth daily before breakfast. 90 tablet 1  . lisinopril (ZESTRIL) 10 MG tablet Take 1 tablet (10 mg total) by mouth daily. 90 tablet 3  . loratadine (CLARITIN) 10 MG tablet Take 10 mg by mouth daily.    . metFORMIN (GLUCOPHAGE) 500 MG tablet TAKE 1 TABLET (500 MG TOTAL) BY MOUTH 2 (TWO) TIMES DAILY WITH A MEAL. 180 tablet 0  . multivitamin-iron-minerals-folic acid (CENTRUM) chewable tablet Chew 1 tablet by mouth daily.      . naproxen sodium (ANAPROX) 220 MG tablet Take 220 mg by mouth 2 (two) times daily with a meal.    . sildenafil (VIAGRA) 100 MG tablet TAKE 1/2 TO 1 TABLET EVERY DAY AS NEEDED FOR ERECTILE DYSFUNCTION 6 tablet 1   Facility-Administered Medications Prior to Visit  Medication Dose Route Frequency Provider Last Rate Last Admin  . 0.9 %  sodium chloride infusion  500 mL Intravenous Continuous Pyrtle, Lajuan Lines, MD        Allergies  Allergen Reactions  . Asa Buff (Mag [Buffered Aspirin]     hives    ROS Review of Systems  Constitutional: Negative for appetite change, chills, fever and unexpected weight change.  Respiratory: Negative for shortness of breath.   Cardiovascular: Negative for chest pain.  Gastrointestinal: Positive for abdominal pain and constipation. Negative for blood in stool, diarrhea, nausea and vomiting.      Objective:    Physical Exam  BP 136/80 (BP Location: Left Arm, Patient Position: Sitting, Cuff Size: Normal)   Pulse 97   Temp 97.6 F (36.4 C) (Temporal)   Wt 258 lb 8 oz (117.3 kg)   SpO2 96%   BMI 36.05 kg/m  Wt Readings from Last 3 Encounters:  02/16/20 258 lb 8 oz (117.3 kg)  12/31/19 269 lb 9.6 oz (122.3 kg)  12/08/19 274 lb 11.2 oz (124.6 kg)     Health Maintenance Due  Topic Date Due  . Hepatitis C Screening  Never done  . FOOT EXAM  Never done  . HIV Screening  Never done  . COLONOSCOPY  02/14/2020    There are no preventive care reminders to display for this patient.  Lab Results    Component Value Date   TSH 0.90 12/08/2019   Lab Results  Component Value Date   WBC 8.7 12/08/2019   HGB 16.5 12/08/2019  HCT 50.2 12/08/2019   MCV 90.8 12/08/2019   PLT 357.0 12/08/2019   Lab Results  Component Value Date   NA 139 12/08/2019   K 4.0 12/08/2019   CO2 26 12/08/2019   GLUCOSE 140 (H) 12/08/2019   BUN 16 12/08/2019   CREATININE 0.85 12/08/2019   BILITOT 0.7 12/08/2019   ALKPHOS 62 12/08/2019   AST 21 12/08/2019   ALT 20 12/08/2019   PROT 7.4 12/08/2019   ALBUMIN 4.1 12/08/2019   CALCIUM 9.3 12/08/2019   GFR 93.83 12/08/2019   Lab Results  Component Value Date   CHOL 176 12/08/2019   Lab Results  Component Value Date   HDL 33.40 (L) 12/08/2019   Lab Results  Component Value Date   LDLCALC 82 03/03/2014   Lab Results  Component Value Date   TRIG 310.0 (H) 12/08/2019   Lab Results  Component Value Date   CHOLHDL 5 12/08/2019   Lab Results  Component Value Date   HGBA1C 7.9 (H) 12/08/2019      Assessment & Plan:   #1 constipation.  He also complains of some left upper quadrant abdominal pain and suspect this may be related to constipation.  Has not had any fever, chills, or other suggestion of likely acute diverticulitis but that could be in differential if pain persists.  He does feel relief after bowel movements which suggest this is probably related to his constipation  -Discussed measures to reduce constipation -Aim for at least 30 g of fiber per day -Increase fluids -Consider over-the-counter MiraLAX -Touch base if above not working  #2 recent syncopal episode which occurred in the setting of straining to have bowel movement.  Highly suspect vasovagal syncope.  No episodes since then  No orders of the defined types were placed in this encounter.   Follow-up: No follow-ups on file.    Carolann Littler, MD

## 2020-02-16 NOTE — Patient Instructions (Signed)
Constipation, Adult Constipation is when a person has fewer bowel movements in a week than normal, has difficulty having a bowel movement, or has stools that are dry, hard, or larger than normal. Constipation may be caused by an underlying condition. It may become worse with age if a person takes certain medicines and does not take in enough fluids. Follow these instructions at home: Eating and drinking   Eat foods that have a lot of fiber, such as fresh fruits and vegetables, whole grains, and beans.  Limit foods that are high in fat, low in fiber, or overly processed, such as french fries, hamburgers, cookies, candies, and soda.  Drink enough fluid to keep your urine clear or pale yellow. General instructions  Exercise regularly or as told by your health care provider.  Go to the restroom when you have the urge to go. Do not hold it in.  Take over-the-counter and prescription medicines only as told by your health care provider. These include any fiber supplements.  Practice pelvic floor retraining exercises, such as deep breathing while relaxing the lower abdomen and pelvic floor relaxation during bowel movements.  Watch your condition for any changes.  Keep all follow-up visits as told by your health care provider. This is important. Contact a health care provider if:  You have pain that gets worse.  You have a fever.  You do not have a bowel movement after 4 days.  You vomit.  You are not hungry.  You lose weight.  You are bleeding from the anus.  You have thin, pencil-like stools. Get help right away if:  You have a fever and your symptoms suddenly get worse.  You leak stool or have blood in your stool.  Your abdomen is bloated.  You have severe pain in your abdomen.  You feel dizzy or you faint. This information is not intended to replace advice given to you by your health care provider. Make sure you discuss any questions you have with your health care  provider. Document Revised: 07/18/2017 Document Reviewed: 01/24/2016 Elsevier Patient Education  [employer] [place].  Stay well hydrated Make sure getting at least 30 grams of fiber per day Consider OTC Miralax and may use up to twice daily. Let me know if you have any fever or other concerning symptoms.

## 2020-03-11 ENCOUNTER — Other Ambulatory Visit: Payer: Self-pay | Admitting: Family Medicine

## 2020-03-13 ENCOUNTER — Other Ambulatory Visit: Payer: Self-pay

## 2020-03-13 ENCOUNTER — Encounter: Payer: Self-pay | Admitting: Family Medicine

## 2020-03-13 ENCOUNTER — Ambulatory Visit: Payer: 59 | Admitting: Family Medicine

## 2020-03-13 VITALS — BP 124/80 | HR 100 | Temp 98.0°F | Wt 259.0 lb

## 2020-03-13 DIAGNOSIS — E1165 Type 2 diabetes mellitus with hyperglycemia: Secondary | ICD-10-CM

## 2020-03-13 DIAGNOSIS — I10 Essential (primary) hypertension: Secondary | ICD-10-CM

## 2020-03-13 DIAGNOSIS — E1140 Type 2 diabetes mellitus with diabetic neuropathy, unspecified: Secondary | ICD-10-CM | POA: Diagnosis not present

## 2020-03-13 DIAGNOSIS — N529 Male erectile dysfunction, unspecified: Secondary | ICD-10-CM

## 2020-03-13 LAB — POCT GLYCOSYLATED HEMOGLOBIN (HGB A1C): Hemoglobin A1C: 6.8 % — AB (ref 4.0–5.6)

## 2020-03-13 MED ORDER — SILDENAFIL CITRATE 100 MG PO TABS: ORAL_TABLET | ORAL | 6 refills | Status: DC

## 2020-03-13 NOTE — Progress Notes (Signed)
Established Patient Office Visit  Subjective:  Patient ID: Gary Mora, male    DOB: 01/15/1966  Age: 54 y.o. MRN: [id number]  CC:  Chief Complaint  Patient presents with  . Follow-up    pt is here for 3 month follow up     HPI LUISFELIPE Mora presents for medical follow-up.  Refer to previous note for details.  His blood sugar was up with A1c 7.9%.  We set him up with certified diabetes educator.  He went 1 time.  He had made some significant lifestyle changes.  His weight is down 15 pounds.  He is scaled back greatly on sweet tea and also has eliminated sodas.  Try to reduce starch intake.  Starting exercise more but still not exercising regularly.  We started lisinopril for hypertension last visit.  He has had no side effects.  Blood pressures by home readings have been improved.  He states he did not take his medication this morning.  History of erectile dysfunction.  Requesting refills of Viagra.  This is worked well in the past with no side effects.  No nitroglycerin use.  No recent chest pains.  Past Medical History:  Diagnosis Date  . Allergy   . Chicken pox   . Chronic kidney disease    kidney stone  . Headache(784.0)    tension after using the computer  . Hypertension   . Sleep apnea    Had surgery to correct no cpapa used  . Substance abuse [facility])     Past Surgical History:  Procedure Laterality Date  . MOUTH SURGERY    . TONSILLECTOMY  2006  . UVULOPALATOPHARYNGOPLASTY  2005    Family History  Problem Relation Age of Onset  . Arthritis Mother   . Arthritis Father   . Hypothyroidism Sister   . Colon cancer Neg Hx   . Esophageal cancer Neg Hx   . Rectal cancer Neg Hx   . Stomach cancer Neg Hx     Social History   Socioeconomic History  . Marital status: Divorced    Spouse name: Not on file  . Number of children: Not on file  . Years of education: Not on file  . Highest education level: Not on file  Occupational History  . Not on file  Tobacco Use   . Smoking status: Former Smoker    Packs/day: 1.00    Years: 30.00    Pack years: 30.00    Types: Cigarettes    Quit date: 11/14/2017    Years since quitting: 2.3  . Smokeless tobacco: Never Used  Vaping Use  . Vaping Use: Never used  Substance and Sexual Activity  . Alcohol use: No  . Drug use: No  . Sexual activity: Not on file  Other Topics Concern  . Not on file  Social History Narrative  . Not on file   Social Determinants of Health   Financial Resource Strain:   . Difficulty of Paying Living Expenses:   Food Insecurity:   . Worried About Charity fundraiser in the Last Year:   . Arboriculturist in the Last Year:   Transportation Needs:   . Film/video editor (Medical):   Marland Kitchen Lack of Transportation (Non-Medical):   Physical Activity:   . Days of Exercise per Week:   . Minutes of Exercise per Session:   Stress:   . Feeling of Stress :   Social Connections:   . Frequency of Communication with  Friends and Family:   . Frequency of Social Gatherings with Friends and Family:   . Attends Religious Services:   . Active Member of Clubs or Organizations:   . Attends Archivist Meetings:   Marland Kitchen Marital Status:   Intimate Partner Violence:   . Fear of Current or Ex-Partner:   . Emotionally Abused:   Marland Kitchen Physically Abused:   . Sexually Abused:     Outpatient Medications Prior to Visit  Medication Sig Dispense Refill  . amLODipine (NORVASC) 5 MG tablet TAKE 1 TABLET BY MOUTH EVERY DAY 90 tablet 0  . Ascorbic Acid (VITAMIN C) 100 MG tablet Take 100 mg by mouth daily.      Marland Kitchen atorvastatin (LIPITOR) 10 MG tablet Take 1 tablet (10 mg total) by mouth daily. 90 tablet 1  . CHANTIX CONTINUING MONTH PAK 1 MG tablet TAKE 1 TABLET BY MOUTH TWICE A DAY 56 tablet 0  . desoximetasone (TOPICORT) 0.25 % cream Apply 1 application topically 2 (two) times daily as needed. 30 g 2  . empagliflozin (JARDIANCE) 10 MG TABS tablet Take 10 mg by mouth daily before breakfast. 90 tablet 1    . lisinopril (ZESTRIL) 10 MG tablet Take 1 tablet (10 mg total) by mouth daily. 90 tablet 3  . loratadine (CLARITIN) 10 MG tablet Take 10 mg by mouth daily.    . metFORMIN (GLUCOPHAGE) 500 MG tablet TAKE 1 TABLET (500 MG TOTAL) BY MOUTH 2 (TWO) TIMES DAILY WITH A MEAL. 180 tablet 0  . multivitamin-iron-minerals-folic acid (CENTRUM) chewable tablet Chew 1 tablet by mouth daily.      . naproxen sodium (ANAPROX) 220 MG tablet Take 220 mg by mouth 2 (two) times daily with a meal.    . sildenafil (VIAGRA) 100 MG tablet TAKE 1/2 TO 1 TABLET EVERY DAY AS NEEDED FOR ERECTILE DYSFUNCTION 6 tablet 1   Facility-Administered Medications Prior to Visit  Medication Dose Route Frequency Provider Last Rate Last Admin  . 0.9 %  sodium chloride infusion  500 mL Intravenous Continuous Pyrtle, Lajuan Lines, MD        Allergies  Allergen Reactions  . Asa Buff (Mag [Buffered Aspirin]     hives    ROS Review of Systems  Constitutional: Negative for fatigue.  Eyes: Negative for visual disturbance.  Respiratory: Negative for cough, chest tightness and shortness of breath.   Cardiovascular: Negative for chest pain, palpitations and leg swelling.  Endocrine: Negative for polydipsia and polyuria.  Neurological: Negative for dizziness, syncope, weakness, light-headedness and headaches.      Objective:    Physical Exam Constitutional:      Appearance: He is well-developed.  HENT:     Right Ear: External ear normal.     Left Ear: External ear normal.  Eyes:     Pupils: Pupils are equal, round, and reactive to light.  Neck:     Thyroid: No thyromegaly.  Cardiovascular:     Rate and Rhythm: Normal rate and regular rhythm.  Pulmonary:     Effort: Pulmonary effort is normal. No respiratory distress.     Breath sounds: Normal breath sounds. No wheezing or rales.  Musculoskeletal:     Cervical back: Neck supple.  Neurological:     Mental Status: He is alert and oriented to person, place, and time.     BP  124/80 (BP Location: Left Arm, Cuff Size: Large)   Pulse 100   Temp 98 F (36.7 C) (Oral)   Wt (!) 259 lb (  117.5 kg)   SpO2 94%   BMI 36.12 kg/m  Wt Readings from Last 3 Encounters:  03/13/20 (!) 259 lb (117.5 kg)  02/16/20 258 lb 8 oz (117.3 kg)  12/31/19 269 lb 9.6 oz (122.3 kg)     Health Maintenance Due  Topic Date Due  . Hepatitis C Screening  Never done  . FOOT EXAM  Never done  . HIV Screening  Never done  . COLONOSCOPY  02/14/2020    There are no preventive care reminders to display for this patient.  Lab Results  Component Value Date   TSH 0.90 12/08/2019   Lab Results  Component Value Date   WBC 8.7 12/08/2019   HGB 16.5 12/08/2019   HCT 50.2 12/08/2019   MCV 90.8 12/08/2019   PLT 357.0 12/08/2019   Lab Results  Component Value Date   NA 139 12/08/2019   K 4.0 12/08/2019   CO2 26 12/08/2019   GLUCOSE 140 (H) 12/08/2019   BUN 16 12/08/2019   CREATININE 0.85 12/08/2019   BILITOT 0.7 12/08/2019   ALKPHOS 62 12/08/2019   AST 21 12/08/2019   ALT 20 12/08/2019   PROT 7.4 12/08/2019   ALBUMIN 4.1 12/08/2019   CALCIUM 9.3 12/08/2019   GFR 93.83 12/08/2019   Lab Results  Component Value Date   CHOL 176 12/08/2019   Lab Results  Component Value Date   HDL 33.40 (L) 12/08/2019   Lab Results  Component Value Date   LDLCALC 82 03/03/2014   Lab Results  Component Value Date   TRIG 310.0 (H) 12/08/2019   Lab Results  Component Value Date   CHOLHDL 5 12/08/2019   Lab Results  Component Value Date   HGBA1C 6.8 (A) 03/13/2020      Assessment & Plan:   #1 type 2 diabetes improving with A1c today 6.8%.  He is done tremendous job with dietary changes lost 15 pounds.  We started Jardiance and that seems to be working well with no side effects  -Continue positive lifestyle changes and plan 25-month follow-up  #2 erectile dysfunction  -Refill Viagra for as needed use  #3 hypertension improved.  Repeat reading with large cuff left arm by me  124/80.  We suspect this will continue to improve with weight loss.  His goal is to get his weight down below 230 pounds  Meds ordered this encounter  Medications  . sildenafil (VIAGRA) 100 MG tablet    Sig: TAKE 1/2 TO 1 TABLET EVERY DAY AS NEEDED FOR ERECTILE DYSFUNCTION    Dispense:  6 tablet    Refill:  6    Follow-up: Return in about 4 months (around 07/14/2020).    Carolann Littler, MD

## 2020-03-13 NOTE — Patient Instructions (Signed)
Keep up the good work.  A1C improved to 6.8%    Let's plan on 4 month follow up.

## 2020-04-07 ENCOUNTER — Ambulatory Visit: Payer: 59 | Admitting: Family Medicine

## 2020-04-07 ENCOUNTER — Other Ambulatory Visit: Payer: Self-pay

## 2020-04-07 ENCOUNTER — Encounter: Payer: Self-pay | Admitting: Family Medicine

## 2020-04-07 VITALS — BP 130/70 | HR 114 | Temp 97.4°F | Wt 258.0 lb

## 2020-04-07 DIAGNOSIS — S39012A Strain of muscle, fascia and tendon of lower back, initial encounter: Secondary | ICD-10-CM

## 2020-04-07 MED ORDER — CYCLOBENZAPRINE HCL 10 MG PO TABS
10.0000 mg | ORAL_TABLET | Freq: Three times a day (TID) | ORAL | 0 refills | Status: DC | PRN
Start: 2020-04-07 — End: 2021-12-18

## 2020-04-07 MED ORDER — METHYLPREDNISOLONE 4 MG PO TBPK
ORAL_TABLET | ORAL | 0 refills | Status: DC
Start: 2020-04-07 — End: 2021-02-21

## 2020-04-07 NOTE — Progress Notes (Signed)
   Subjective:    Patient ID: Gary Mora, male    DOB: August 15, 1966, 54 y.o.   MRN: [id number]  HPI Here for 2 days of tightness and pain in the left lower back and the left hip. No trauma hx but he worked out at [employer] yesterday and this pain began last night. Ibuprofen and ice have helped a little.    Review of Systems  Constitutional: Negative.   Respiratory: Negative.   Cardiovascular: Negative.   Musculoskeletal: Positive for back pain.       Objective:   Physical Exam Constitutional:      Appearance: Normal appearance.  Cardiovascular:     Rate and Rhythm: Normal rate and regular rhythm.     Pulses: Normal pulses.     Heart sounds: Normal heart sounds.  Pulmonary:     Effort: Pulmonary effort is normal.     Breath sounds: Normal breath sounds.  Musculoskeletal:     Comments: His lower back has no tenderness but there is a bit if spasm. ROM is full. Both hips are normal on exam   Neurological:     Mental Status: He is alert.           Assessment & Plan:  Lumbar strain. Try Flexeril and a Medrol dose pack.  Alysia Penna, MD

## 2020-04-23 ENCOUNTER — Other Ambulatory Visit: Payer: Self-pay | Admitting: Family Medicine

## 2020-06-07 ENCOUNTER — Other Ambulatory Visit: Payer: Self-pay | Admitting: Family Medicine

## 2020-06-08 ENCOUNTER — Other Ambulatory Visit: Payer: Self-pay | Admitting: Family Medicine

## 2020-06-18 ENCOUNTER — Other Ambulatory Visit: Payer: Self-pay | Admitting: Family Medicine

## 2020-07-27 ENCOUNTER — Other Ambulatory Visit: Payer: Self-pay | Admitting: Family Medicine

## 2020-07-27 NOTE — Telephone Encounter (Signed)
Last OV 03/13/20 Last refill 04/29/20 #90/0 Next OV not scheduled

## 2020-09-07 ENCOUNTER — Other Ambulatory Visit: Payer: Self-pay | Admitting: Family Medicine

## 2020-10-25 ENCOUNTER — Other Ambulatory Visit: Payer: Self-pay | Admitting: Family Medicine

## 2020-12-07 ENCOUNTER — Other Ambulatory Visit: Payer: Self-pay | Admitting: Family Medicine

## 2021-02-21 ENCOUNTER — Encounter: Payer: Self-pay | Admitting: Family Medicine

## 2021-02-21 ENCOUNTER — Other Ambulatory Visit: Payer: Self-pay

## 2021-02-21 ENCOUNTER — Ambulatory Visit (INDEPENDENT_AMBULATORY_CARE_PROVIDER_SITE_OTHER): Payer: 59 | Admitting: Family Medicine

## 2021-02-21 VITALS — BP 132/90 | HR 62 | Temp 97.6°F | Ht 71.0 in | Wt 259.4 lb

## 2021-02-21 DIAGNOSIS — Z87891 Personal history of nicotine dependence: Secondary | ICD-10-CM | POA: Diagnosis not present

## 2021-02-21 DIAGNOSIS — Z0000 Encounter for general adult medical examination without abnormal findings: Secondary | ICD-10-CM

## 2021-02-21 DIAGNOSIS — Z125 Encounter for screening for malignant neoplasm of prostate: Secondary | ICD-10-CM

## 2021-02-21 DIAGNOSIS — Z86010 Personal history of colonic polyps: Secondary | ICD-10-CM | POA: Diagnosis not present

## 2021-02-21 LAB — CBC WITH DIFFERENTIAL/PLATELET
Basophils Absolute: 0.1 10*3/uL (ref 0.0–0.1)
Basophils Relative: 1 % (ref 0.0–3.0)
Eosinophils Absolute: 0.3 10*3/uL (ref 0.0–0.7)
Eosinophils Relative: 3 % (ref 0.0–5.0)
HCT: 54.1 % — ABNORMAL HIGH (ref 39.0–52.0)
Hemoglobin: 18.1 g/dL (ref 13.0–17.0)
Lymphocytes Relative: 26.7 % (ref 12.0–46.0)
Lymphs Abs: 2.4 10*3/uL (ref 0.7–4.0)
MCHC: 33.4 g/dL (ref 30.0–36.0)
MCV: 88.9 fl (ref 78.0–100.0)
Monocytes Absolute: 0.6 10*3/uL (ref 0.1–1.0)
Monocytes Relative: 6.5 % (ref 3.0–12.0)
Neutro Abs: 5.7 10*3/uL (ref 1.4–7.7)
Neutrophils Relative %: 62.8 % (ref 43.0–77.0)
Platelets: 335 10*3/uL (ref 150.0–400.0)
RBC: 6.08 Mil/uL — ABNORMAL HIGH (ref 4.22–5.81)
RDW: 13.5 % (ref 11.5–15.5)
WBC: 9.1 10*3/uL (ref 4.0–10.5)

## 2021-02-21 LAB — BASIC METABOLIC PANEL
BUN: 14 mg/dL (ref 6–23)
CO2: 25 mEq/L (ref 19–32)
Calcium: 9.6 mg/dL (ref 8.4–10.5)
Chloride: 100 mEq/L (ref 96–112)
Creatinine, Ser: 1 mg/dL (ref 0.40–1.50)
GFR: 84.73 mL/min (ref >60.00)
Glucose, Bld: 106 mg/dL — ABNORMAL HIGH (ref 70–99)
Potassium: 4.2 mEq/L (ref 3.5–5.1)
Sodium: 136 mEq/L (ref 135–145)

## 2021-02-21 LAB — HEPATIC FUNCTION PANEL
ALT: 22 U/L (ref 0–53)
AST: 21 U/L (ref 0–37)
Albumin: 4.2 g/dL (ref 3.5–5.2)
Alkaline Phosphatase: 60 U/L (ref 39–117)
Bilirubin, Direct: 0.2 mg/dL (ref 0.0–0.3)
Total Bilirubin: 1 mg/dL (ref 0.2–1.2)
Total Protein: 7.3 g/dL (ref 6.0–8.3)

## 2021-02-21 LAB — PSA: PSA: 1.44 ng/mL (ref 0.10–4.00)

## 2021-02-21 LAB — LDL CHOLESTEROL, DIRECT: Direct LDL: 97 mg/dL

## 2021-02-21 LAB — LIPID PANEL
Cholesterol: 149 mg/dL (ref 0–200)
HDL: 32.3 mg/dL — ABNORMAL LOW (ref >39.00)
NonHDL: 116.53
Total CHOL/HDL Ratio: 5
Triglycerides: 247 mg/dL — ABNORMAL HIGH (ref 0.0–149.0)
VLDL: 49.4 mg/dL — ABNORMAL HIGH (ref 0.0–40.0)

## 2021-02-21 LAB — TSH: TSH: 0.48 u[IU]/mL (ref 0.35–5.50)

## 2021-02-21 LAB — HEMOGLOBIN A1C: Hgb A1c MFr Bld: 7.3 % — ABNORMAL HIGH (ref 4.6–6.5)

## 2021-02-21 NOTE — Progress Notes (Signed)
Established Patient Office Visit  Subjective:  Patient ID: Gary Mora, male    DOB: 1966-03-19  Age: 55 y.o. MRN: [id number]  CC: No chief complaint on file.   HPI  Gary Mora presents for physical exam.  He has chronic problems including history of obesity, hypertension, obstructive sleep apnea, type 2 diabetes, kidney stones, metabolic syndrome, multiple colon polyps.  He is due for repeat colonoscopy.  Was actually due last year but apparently this was not set up.  He quit smoking about 4 years ago.  Does not exercise regularly.  Stays extremely busy helping to care for his parents.  His father has advanced dementia and his mom and dad live with him.  Health maintenance reviewed  -No history of hepatitis C screening.  He is low risk. -Needs follow-up colonoscopy as above -Has had initial COVID vaccines but no booster -Has had prior Pneumovax. -Tetanus due 2024  -Social history-he divorced about 12 years ago.  He has 2 children ages 56 and 18.  Quit smoking 4 years ago.  Over 30-pack-year history.  Previous history of heavy alcohol use with some binge drinking but quit completely about 7 years ago.  Family history-father has advanced dementia.  Is not sure which type.  Both his parents had significant degenerative arthritis.  He has 1 brother and 2 sisters.  No active medical problems in them.  Past Medical History:  Diagnosis Date   Allergy    Chicken pox    Chronic kidney disease    kidney stone   Headache(784.0)    tension after using the computer   Hypertension    Sleep apnea    Had surgery to correct no cpapa used   Substance abuse ([place])     Past Surgical History:  Procedure Laterality Date   MOUTH SURGERY     TONSILLECTOMY  2006   UVULOPALATOPHARYNGOPLASTY  2005    Family History  Problem Relation Age of Onset   Arthritis Mother    Arthritis Father    Dementia Father    Hypothyroidism Sister    Colon cancer Neg Hx    Esophageal cancer Neg Hx     Rectal cancer Neg Hx    Stomach cancer Neg Hx     Social History   Socioeconomic History   Marital status: Divorced    Spouse name: Not on file   Number of children: Not on file   Years of education: Not on file   Highest education level: Not on file  Occupational History   Not on file  Tobacco Use   Smoking status: Former    Packs/day: 1.00    Years: 30.00    Pack years: 30.00    Types: Cigarettes    Quit date: 11/14/2017    Years since quitting: 3.2   Smokeless tobacco: Never  Vaping Use   Vaping Use: Never used  Substance and Sexual Activity   Alcohol use: No   Drug use: No   Sexual activity: Not on file  Other Topics Concern   Not on file  Social History Narrative   Not on file   Social Determinants of Health   Financial Resource Strain: Not on file  Food Insecurity: Not on file  Transportation Needs: Not on file  Physical Activity: Not on file  Stress: Not on file  Social Connections: Not on file  Intimate Partner Violence: Not on file    Outpatient Medications Prior to Visit  Medication Sig Dispense Refill  amLODipine (NORVASC) 5 MG tablet TAKE 1 TABLET BY MOUTH EVERY DAY 90 tablet 0   Ascorbic Acid (VITAMIN C) 100 MG tablet Take 100 mg by mouth daily.       atorvastatin (LIPITOR) 10 MG tablet TAKE 1 TABLET BY MOUTH EVERY DAY 90 tablet 1   CHANTIX CONTINUING MONTH PAK 1 MG tablet TAKE 1 TABLET BY MOUTH TWICE A DAY 56 tablet 0   cyclobenzaprine (FLEXERIL) 10 MG tablet Take 1 tablet (10 mg total) by mouth 3 (three) times daily as needed for muscle spasms. 60 tablet 0   desoximetasone (TOPICORT) 0.25 % cream Apply 1 application topically 2 (two) times daily as needed. 30 g 2   JARDIANCE 10 MG TABS tablet TAKE 1 TABLET BY MOUTH EVERY DAY BEFORE BREAKFAST 90 tablet 1   lisinopril (ZESTRIL) 10 MG tablet TAKE 1 TABLET BY MOUTH EVERY DAY 90 tablet 3   loratadine (CLARITIN) 10 MG tablet Take 10 mg by mouth daily.     metFORMIN (GLUCOPHAGE) 500 MG tablet TAKE 1  TABLET BY MOUTH 2 TIMES DAILY WITH A MEAL. 180 tablet 0   multivitamin-iron-minerals-folic acid (CENTRUM) chewable tablet Chew 1 tablet by mouth daily.       sildenafil (VIAGRA) 100 MG tablet TAKE 1/2 TO 1 TABLET BY MOUTH EVERY DAY AS NEEDED FOR ERECTILE DYSFUNCTION 6 tablet 6   methylPREDNISolone (MEDROL DOSEPAK) 4 MG TBPK tablet As directed 21 tablet 0   naproxen sodium (ANAPROX) 220 MG tablet Take 220 mg by mouth 2 (two) times daily with a meal.     Facility-Administered Medications Prior to Visit  Medication Dose Route Frequency Provider Last Rate Last Admin   0.9 %  sodium chloride infusion  500 mL Intravenous Continuous Pyrtle, Lajuan Lines, MD        Allergies  Allergen Reactions   Asa Buff (Mag [Buffered Aspirin]     hives    ROS Review of Systems  Constitutional:  Negative for activity change, appetite change, fatigue and fever.  HENT:  Negative for congestion, ear pain and trouble swallowing.   Eyes:  Negative for pain and visual disturbance.  Respiratory:  Negative for cough, shortness of breath and wheezing.   Cardiovascular:  Negative for chest pain and palpitations.  Gastrointestinal:  Negative for abdominal distention, abdominal pain, blood in stool, constipation, diarrhea, nausea, rectal pain and vomiting.  Genitourinary:  Negative for dysuria, hematuria and testicular pain.  Musculoskeletal:  Negative for arthralgias and joint swelling.  Skin:  Negative for rash.  Neurological:  Negative for dizziness, syncope and headaches.  Hematological:  Negative for adenopathy.  Psychiatric/Behavioral:  Negative for confusion and dysphoric mood.      Objective:    Physical Exam Constitutional:      General: He is not in acute distress.    Appearance: He is well-developed.  HENT:     Head: Normocephalic and atraumatic.     Right Ear: External ear normal.     Left Ear: External ear normal.  Eyes:     Conjunctiva/sclera: Conjunctivae normal.     Pupils: Pupils are equal,  round, and reactive to light.  Neck:     Thyroid: No thyromegaly.  Cardiovascular:     Rate and Rhythm: Normal rate and regular rhythm.     Heart sounds: Normal heart sounds. No murmur heard. Pulmonary:     Effort: No respiratory distress.     Breath sounds: No wheezing or rales.  Abdominal:     General: Bowel sounds are  normal. There is no distension.     Palpations: Abdomen is soft. There is no mass.     Tenderness: There is no abdominal tenderness. There is no guarding or rebound.  Musculoskeletal:     Cervical back: Normal range of motion and neck supple.  Lymphadenopathy:     Cervical: No cervical adenopathy.  Skin:    Findings: No rash.  Neurological:     Mental Status: He is alert and oriented to person, place, and time.     Cranial Nerves: No cranial nerve deficit.     Deep Tendon Reflexes: Reflexes normal.    BP 132/90 (BP Location: Left Arm, Cuff Size: Normal)  Wt Readings from Last 3 Encounters:  04/07/20 258 lb (117 kg)  03/13/20 (!) 259 lb (117.5 kg)  02/16/20 258 lb 8 oz (117.3 kg)     Health Maintenance Due  Topic Date Due   FOOT EXAM  Never done   HIV Screening  Never done   Hepatitis C Screening  Never done   COLONOSCOPY (Pts 45-59yrs Insurance coverage will need to be confirmed)  02/14/2020   COVID-19 Vaccine (3 - Booster for Pfizer series) 04/14/2020   HEMOGLOBIN A1C  09/13/2020    There are no preventive care reminders to display for this patient.  Lab Results  Component Value Date   TSH 0.90 12/08/2019   Lab Results  Component Value Date   WBC 8.7 12/08/2019   HGB 16.5 12/08/2019   HCT 50.2 12/08/2019   MCV 90.8 12/08/2019   PLT 357.0 12/08/2019   Lab Results  Component Value Date   NA 139 12/08/2019   K 4.0 12/08/2019   CO2 26 12/08/2019   GLUCOSE 140 (H) 12/08/2019   BUN 16 12/08/2019   CREATININE 0.85 12/08/2019   BILITOT 0.7 12/08/2019   ALKPHOS 62 12/08/2019   AST 21 12/08/2019   ALT 20 12/08/2019   PROT 7.4 12/08/2019    ALBUMIN 4.1 12/08/2019   CALCIUM 9.3 12/08/2019   GFR 93.83 12/08/2019   Lab Results  Component Value Date   CHOL 176 12/08/2019   Lab Results  Component Value Date   HDL 33.40 (L) 12/08/2019   Lab Results  Component Value Date   LDLCALC 82 03/03/2014   Lab Results  Component Value Date   TRIG 310.0 (H) 12/08/2019   Lab Results  Component Value Date   CHOLHDL 5 12/08/2019   Lab Results  Component Value Date   HGBA1C 6.8 (A) 03/13/2020      Assessment & Plan:   Problem List Items Addressed This Visit   None Visit Diagnoses     Physical exam    -  Primary   Relevant Orders   Basic metabolic panel   Lipid panel   CBC with Differential/Platelet   TSH   Hepatic function panel   PSA   Hemoglobin A1c   Hep C Antibody   History of colon polyps       Relevant Orders   Ambulatory referral to Gastroenterology   History of cigarette smoking       Relevant Orders   Ambulatory Referral for Lung Cancer Scre     -Recommend annual flu vaccine -We discussed getting low-dose CT lung cancer screening and he qualifies and is interested in this -Set up repeat colonoscopy -Obtain follow-up labs.  Include A1c with past history of type 2 diabetes -Check hepatitis C antibody -We strongly advise he try to lose some additional weight and establish more consistent exercise -  We will determine next interval for follow-up labs depending on results above  No orders of the defined types were placed in this encounter.   Follow-up: Return in about 6 months (around 08/24/2021).    Carolann Littler, MD

## 2021-02-22 LAB — HEPATITIS C ANTIBODY
Hepatitis C Ab: NONREACTIVE
SIGNAL TO CUT-OFF: 0.03 (ref <1.00)

## 2021-02-28 ENCOUNTER — Other Ambulatory Visit: Payer: Self-pay | Admitting: *Deleted

## 2021-02-28 DIAGNOSIS — Z87891 Personal history of nicotine dependence: Secondary | ICD-10-CM

## 2021-03-01 ENCOUNTER — Other Ambulatory Visit: Payer: Self-pay

## 2021-03-01 ENCOUNTER — Ambulatory Visit (AMBULATORY_SURGERY_CENTER): Payer: 59

## 2021-03-01 VITALS — Ht 71.0 in | Wt 259.0 lb

## 2021-03-01 DIAGNOSIS — Z86010 Personal history of colonic polyps: Secondary | ICD-10-CM

## 2021-03-01 MED ORDER — PEG-KCL-NACL-NASULF-NA ASC-C 100 G PO SOLR: 1.0000 | Freq: Once | ORAL | 0 refills | Status: AC

## 2021-03-01 NOTE — Progress Notes (Signed)
Patient's pre-visit was done today over the phone with the patient due to COVID-19 pandemic.   Name,DOB and address verified. I. Patient denies any allergies to Eggs and Soy. Patient denies any problems with anesthesia/sedation. Patient denies taking diet pills or blood thinners. No home Oxygen. Packet of Prep instructions mailed to patient including a copy of a consent form-pt is aware. Patient understands to call us back with any questions or concerns. Patient is aware of our care-partner policy and [id number] safety protocol.    Takes miralax as needed but goes daily or every other day generally.

## 2021-03-08 ENCOUNTER — Other Ambulatory Visit: Payer: Self-pay | Admitting: Family Medicine

## 2021-03-13 ENCOUNTER — Telehealth: Payer: Self-pay | Admitting: Internal Medicine

## 2021-03-13 DIAGNOSIS — Z86010 Personal history of colonic polyps: Secondary | ICD-10-CM

## 2021-03-13 MED ORDER — PEG 3350-KCL-NA BICARB-NACL 420 G PO SOLR: 4000.0000 mL | Freq: Once | ORAL | 0 refills | Status: AC

## 2021-03-13 NOTE — Telephone Encounter (Signed)
Spoke with pt and explained that prior authorizations aren't done.  No samples of Plenvu available and allergy contraindication for Sutab.  Pt is agreeable to Golytely.  RX sent to pharmacy and new instructions sent via [place]

## 2021-03-16 ENCOUNTER — Other Ambulatory Visit: Payer: Self-pay

## 2021-03-16 ENCOUNTER — Ambulatory Visit (AMBULATORY_SURGERY_CENTER): Payer: 59 | Admitting: Internal Medicine

## 2021-03-16 ENCOUNTER — Encounter: Payer: Self-pay | Admitting: Internal Medicine

## 2021-03-16 VITALS — BP 125/81 | HR 88 | Temp 98.4°F | Resp 13 | Ht 71.0 in | Wt 259.0 lb

## 2021-03-16 DIAGNOSIS — K635 Polyp of colon: Secondary | ICD-10-CM | POA: Diagnosis not present

## 2021-03-16 DIAGNOSIS — Z86010 Personal history of colonic polyps: Secondary | ICD-10-CM

## 2021-03-16 DIAGNOSIS — D125 Benign neoplasm of sigmoid colon: Secondary | ICD-10-CM

## 2021-03-16 DIAGNOSIS — D122 Benign neoplasm of ascending colon: Secondary | ICD-10-CM

## 2021-03-16 MED ORDER — SODIUM CHLORIDE 0.9 % IV SOLN: 500.0000 mL | Freq: Once | INTRAVENOUS | Status: DC

## 2021-03-16 NOTE — Progress Notes (Signed)
Pt's states no medical or surgical changes since previsit or office visit.   CW vitals and NS IV.

## 2021-03-16 NOTE — Progress Notes (Signed)
To PACU, VSS. Report to [email or web address] 

## 2021-03-16 NOTE — Op Note (Signed)
[facility] Patient Name: Gary Mora Procedure Date: 03/16/2021 7:16 AM MRN: [id number] Endoscopist: Jerene Bears , MD Age: 55 Referring MD:  Date of Birth: Mar 20, 1966 Gender: Male Account #: [id number] Procedure:                Colonoscopy Indications:              High risk colon cancer surveillance: Personal                            history of multiple adenomas and SSP (9 at last                            exam), Last colonoscopy: June 2018 Medicines:                Monitored Anesthesia Care Procedure:                Pre-Anesthesia Assessment:                           - Prior to the procedure, a History and Physical                            was performed, and patient medications and                            allergies were reviewed. The patient's tolerance of                            previous anesthesia was also reviewed. The risks                            and benefits of the procedure and the sedation                            options and risks were discussed with the patient.                            All questions were answered, and informed consent                            was obtained. Prior Anticoagulants: The patient has                            taken no previous anticoagulant or antiplatelet                            agents. ASA Grade Assessment: III - A patient with                            severe systemic disease. After reviewing the risks                            and benefits, the patient was deemed in  satisfactory condition to undergo the procedure.                           After obtaining informed consent, the colonoscope                            was passed under direct vision. Throughout the                            procedure, the patient's blood pressure, pulse, and                            oxygen saturations were monitored continuously. The                            Olympus CF-HQ190L (Serial# [id number])  Colonoscope was                            introduced through the anus and advanced to the                            cecum, identified by appendiceal orifice and                            ileocecal valve. The colonoscopy was performed                            without difficulty. The patient tolerated the                            procedure well. The quality of the bowel                            preparation was good. The ileocecal valve,                            appendiceal orifice, and rectum were photographed. Scope In: 8:17:07 AM Scope Out: [id number] AM Scope Withdrawal Time: 0 hours 13 minutes 51 seconds  Total Procedure Duration: 0 hours 19 minutes 42 seconds  Findings:                 The digital rectal exam was normal.                           A 4 mm polyp was found in the ascending colon. The                            polyp was sessile. The polyp was removed with a                            cold snare. Resection and retrieval were complete.                           A 6 mm polyp was found in the sigmoid  colon. The                            polyp was sessile. The polyp was removed with a                            cold snare. Resection and retrieval were complete.                           Multiple small and large-mouthed diverticula were                            found in the sigmoid colon, descending colon and                            ascending colon.                           Internal hemorrhoids were found during                            retroflexion. The hemorrhoids were small. Complications:            No immediate complications. Estimated Blood Loss:     Estimated blood loss: none. Impression:               - One 4 mm polyp in the ascending colon, removed                            with a cold snare. Resected and retrieved.                           - One 6 mm polyp in the sigmoid colon, removed with                            a cold snare. Resected and  retrieved.                           - Diverticulosis in the sigmoid colon, in the                            descending colon and in the ascending colon.                           - Internal hemorrhoids. Recommendation:           - Patient has a contact number available for                            emergencies. The signs and symptoms of potential                            delayed complications were discussed with the                            patient. Return to  normal activities tomorrow.                            Written discharge instructions were provided to the                            patient.                           - Resume previous diet.                           - Continue present medications.                           - Await pathology results.                           - Repeat colonoscopy in 5 years for surveillance. Jerene Bears, MD 03/16/2021 8:42:16 AM This report has been signed electronically.

## 2021-03-16 NOTE — Patient Instructions (Addendum)
Handouts were given to your care partner on polyps, diverticulosis, and hemorrhoids. Your sugar in the recovery room was 115. You may resume your current medications today. Await biopsy results.  May take 1-3 weeks to receive pathology results. Repeat colonoscopy in 5 years. Please call if any questions or concerns.      YOU HAD AN ENDOSCOPIC PROCEDURE TODAY AT [facility]:   Refer to the procedure report that was given to you for any specific questions about what was found during the examination.  If the procedure report does not answer your questions, please call your gastroenterologist to clarify.  If you requested that your care partner not be given the details of your procedure findings, then the procedure report has been included in a sealed envelope for you to review at your convenience later.  YOU SHOULD EXPECT: Some feelings of bloating in the abdomen. Passage of more gas than usual.  Walking can help get rid of the air that was put into your GI tract during the procedure and reduce the bloating. If you had a lower endoscopy (such as a colonoscopy or flexible sigmoidoscopy) you may notice spotting of blood in your stool or on the toilet paper. If you underwent a bowel prep for your procedure, you may not have a normal bowel movement for a few days.  Please Note:  You might notice some irritation and congestion in your nose or some drainage.  This is from the oxygen used during your procedure.  There is no need for concern and it should clear up in a day or so.  SYMPTOMS TO REPORT IMMEDIATELY:  Following lower endoscopy (colonoscopy or flexible sigmoidoscopy):  Excessive amounts of blood in the stool  Significant tenderness or worsening of abdominal pains  Swelling of the abdomen that is new, acute  Fever of 100F or higher   For urgent or emergent issues, a gastroenterologist can be reached at any hour by calling [phone]. Do not use MyChart messaging for  urgent concerns.    DIET:  We do recommend a small meal at first, but then you may proceed to your regular diet.  Drink plenty of fluids but you should avoid alcoholic beverages for 24 hours.  ACTIVITY:  You should plan to take it easy for the rest of today and you should NOT DRIVE or use heavy machinery until tomorrow (because of the sedation medicines used during the test).    FOLLOW UP: Our staff will call the number listed on your records 48-72 hours following your procedure to check on you and address any questions or concerns that you may have regarding the information given to you following your procedure. If we do not reach you, we will leave a message.  We will attempt to reach you two times.  During this call, we will ask if you have developed any symptoms of COVID 19. If you develop any symptoms (ie: fever, flu-like symptoms, shortness of breath, cough etc.) before then, please call [phone].  If you test positive for Covid 19 in the 2 weeks post procedure, please call and report this information to [place].    If any biopsies were taken you will be contacted by phone or by letter within the next 1-3 weeks.  Please call us at [phone] if you have not heard about the biopsies in 3 weeks.    SIGNATURES/CONFIDENTIALITY: You and/or your care partner have signed paperwork which will be entered into your electronic medical record.  These  signatures attest to the fact that that the information above on your After Visit Summary has been reviewed and is understood.  Full responsibility of the confidentiality of this discharge information lies with you and/or your care-partner.

## 2021-03-16 NOTE — Progress Notes (Signed)
Called to room to assist during endoscopic procedure.  Patient ID and intended procedure confirmed with present staff. Received instructions for my participation in the procedure from the performing physician.  

## 2021-03-16 NOTE — Progress Notes (Signed)
No problems noted in the recovery room. maw 

## 2021-03-20 ENCOUNTER — Telehealth: Payer: Self-pay

## 2021-03-20 NOTE — Telephone Encounter (Signed)
  Follow up Call-  Call back number 03/16/2021  Post procedure Call Back phone  # [phone]  Permission to leave phone message Yes  Some recent data might be hidden     Patient questions:  Do you have a fever, pain , or abdominal swelling? No. Pain Score  0 *  Have you tolerated food without any problems? Yes.    Have you been able to return to your normal activities? Yes.    Do you have any questions about your discharge instructions: Diet   No. Medications  No. Follow up visit  No.  Do you have questions or concerns about your Care? No.  Actions: * If pain score is 4 or above: No action needed, pain <4.  Have you developed a fever since your procedure? no  2.   Have you had an respiratory symptoms (SOB or cough) since your procedure? no  3.   Have you tested positive for COVID 19 since your procedure no  4.   Have you had any family members/close contacts diagnosed with the COVID 19 since your procedure?  no   If yes to any of these questions please route to Joylene John, RN and Joella Prince, RN

## 2021-03-21 ENCOUNTER — Encounter: Payer: Self-pay | Admitting: Internal Medicine

## 2021-03-26 ENCOUNTER — Other Ambulatory Visit: Payer: Self-pay | Admitting: Family Medicine

## 2021-04-02 ENCOUNTER — Telehealth: Payer: Self-pay | Admitting: Acute Care

## 2021-04-02 NOTE — Telephone Encounter (Signed)
Left message for pt to return call to r/s [facility] and [facility].

## 2021-04-04 ENCOUNTER — Ambulatory Visit: Payer: 59

## 2021-04-04 ENCOUNTER — Telehealth: Payer: 59 | Admitting: Acute Care

## 2021-04-05 NOTE — Telephone Encounter (Signed)
Will close this message and refer to referral notes 

## 2021-05-25 IMAGING — DX DG FOOT COMPLETE 3+V*R*
3 series · 3 of 3 positions shown · non-contrast
Comparison: None.

CLINICAL DATA: Ulcer lung lateral aspect fifth MTP joint

EXAM:
RIGHT FOOT COMPLETE - 3+ VIEW

[foot ap]
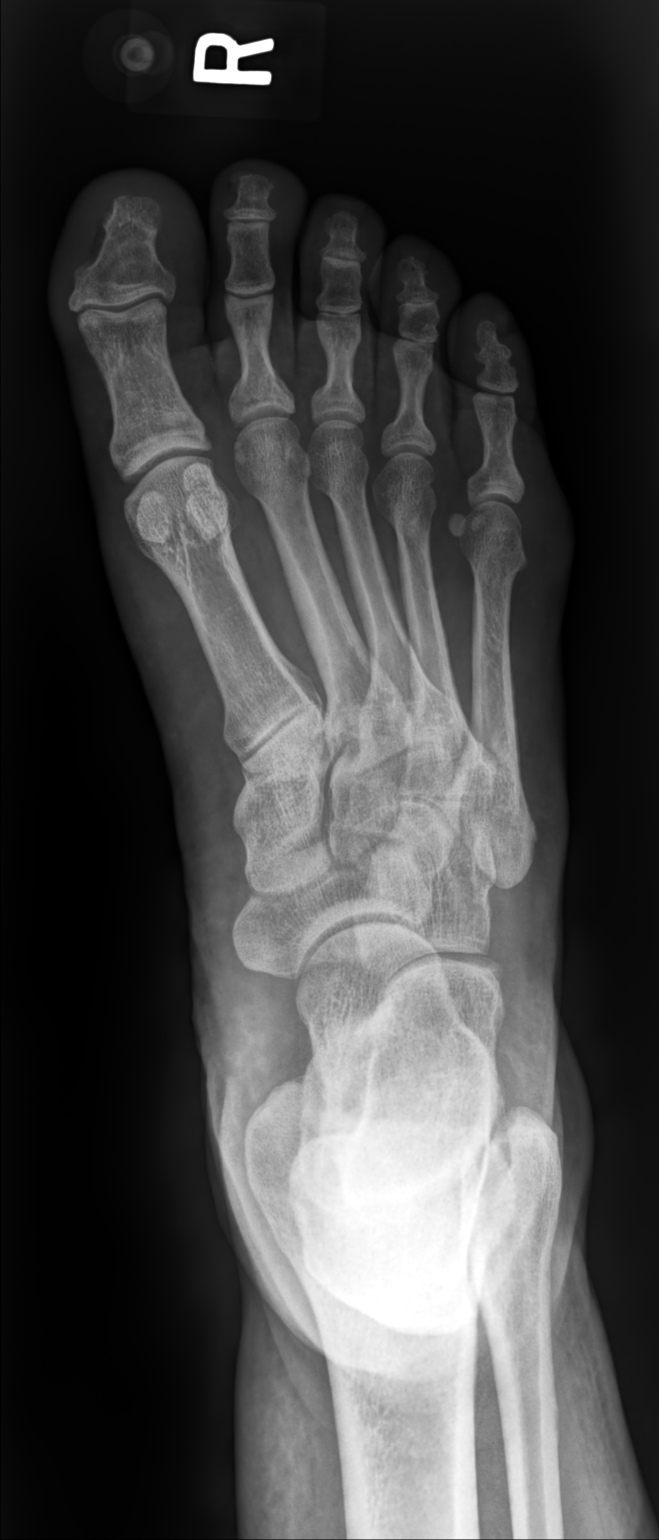

[foot mlo]
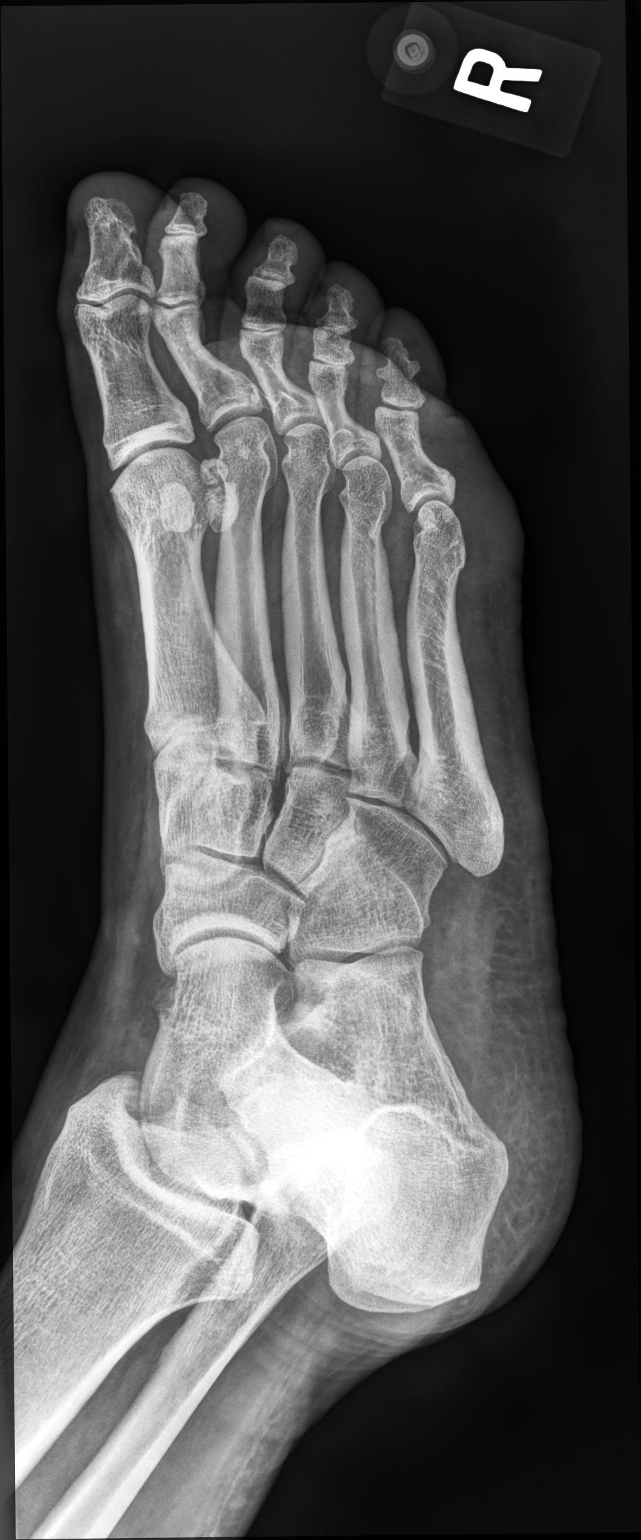

[foot lat]
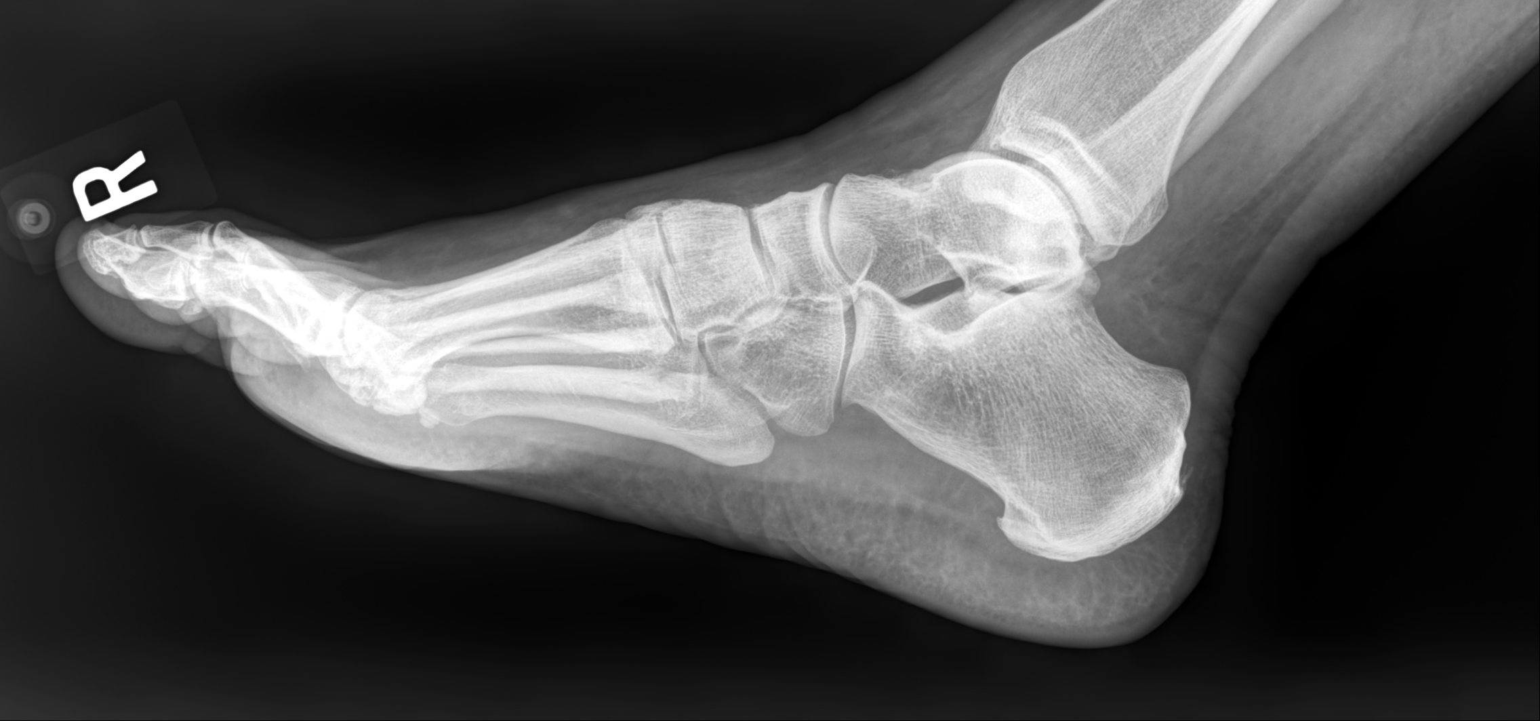

[3 of 3 positions shown; findings below may reference images not displayed]

FINDINGS: No fracture or malalignment. Small plantar calcaneal spur. Soft
tissue swelling at the fifth MTP joint. No radiopaque foreign body
IMPRESSION: No acute osseous abnormality.  Negative for radiopaque foreign body

## 2021-06-07 ENCOUNTER — Other Ambulatory Visit: Payer: Self-pay | Admitting: Family Medicine

## 2021-06-25 ENCOUNTER — Other Ambulatory Visit: Payer: Self-pay | Admitting: Family Medicine

## 2021-07-21 ENCOUNTER — Other Ambulatory Visit: Payer: Self-pay | Admitting: Family Medicine

## 2021-09-07 ENCOUNTER — Other Ambulatory Visit: Payer: Self-pay | Admitting: Family Medicine

## 2021-09-20 ENCOUNTER — Other Ambulatory Visit: Payer: Self-pay | Admitting: Family Medicine

## 2021-11-03 ENCOUNTER — Encounter (HOSPITAL_COMMUNITY): Payer: Self-pay | Admitting: *Deleted

## 2021-11-03 ENCOUNTER — Ambulatory Visit (HOSPITAL_COMMUNITY)
Admission: EM | Admit: 2021-11-03 | Discharge: 2021-11-03 | Disposition: A | Discharge status: Home / Self Care | Payer: 59 | Attending: Emergency Medicine | Admitting: Emergency Medicine

## 2021-11-03 ENCOUNTER — Other Ambulatory Visit: Payer: Self-pay

## 2021-11-03 DIAGNOSIS — U071 COVID-19: Secondary | ICD-10-CM

## 2021-11-03 MED ORDER — GUAIFENESIN ER 600 MG PO TB12: 1200.0000 mg | ORAL_TABLET | Freq: Two times a day (BID) | ORAL | 1 refills | Status: DC

## 2021-11-03 MED ORDER — MOLNUPIRAVIR EUA 200MG CAPSULE: 4.0000 | ORAL_CAPSULE | Freq: Two times a day (BID) | ORAL | 0 refills | Status: AC

## 2021-11-03 NOTE — ED Provider Notes (Signed)
?[facility] ? ? ? ?CSN: [id number] ?Arrival date & time: 11/03/21  1101 ? ? ?  ? ?History   ?Chief Complaint ?Chief Complaint  ?Patient presents with  ? Headache  ? Chills  ? Cough  ? Sore Throat  ? ? ?HPI ?Gary Mora is a 56 y.o. male.  ? ?Patient presents with fever, chills, nasal congestion, rhinorrhea, sore throat, generalized coughing and generalized headaches for 1 day.  Decreased appetite but tolerating fluids.  Known COVID exposure in household.  Took a home test yesterday which was positive.  Has not attempted treatment of symptoms.  Requesting antiviral treatment.  ? ?Past Medical History:  ?Diagnosis Date  ? Allergy   ? Chicken pox   ? Chronic kidney disease   ? kidney stone  ? Diabetes mellitus without complication ([place])   ? Headache(784.0)   ? tension after using the computer  ? Hypertension   ? Sleep apnea   ? Had surgery to correct no cpapa used  ? Substance abuse ([place])   ? ? ?Patient Active Problem List  ? Diagnosis Date Noted  ? Poorly controlled type 2 diabetes mellitus with neuropathy ([place]) 09/26/2015  ? Essential hypertension 07/11/2015  ? Abdominal pain, left lower quadrant 03/04/2015  ? Obesity (BMI 30-39.9) 03/11/2014  ? Renal calculus 12/21/2012  ? Metabolic syndrome 94/76/5465  ? OSA (obstructive sleep apnea) 08/22/2011  ? Smoking 08/22/2011  ? ? ?Past Surgical History:  ?Procedure Laterality Date  ? COLONOSCOPY    ? 2018  ? MOUTH SURGERY    ? TONSILLECTOMY  2006  ? UVULOPALATOPHARYNGOPLASTY  2005  ? ? ? ? ? ?Home Medications   ? ?Prior to Admission medications   ?Medication Sig Start Date End Date Taking? Authorizing Provider  ?amLODipine (NORVASC) 5 MG tablet TAKE 1 TABLET BY MOUTH EVERY DAY 09/20/21   Burchette, Alinda Sierras, MD  ?Ascorbic Acid (VITAMIN C) 100 MG tablet Take 100 mg by mouth daily.      [provider]  ?atorvastatin (LIPITOR) 10 MG tablet TAKE 1 TABLET BY MOUTH EVERY DAY 03/27/21   Burchette, Alinda Sierras, MD  ?CHANTIX CONTINUING MONTH PAK 1 MG tablet TAKE 1  TABLET BY MOUTH TWICE A DAY ?Patient not taking: Reported on 03/01/2021 03/04/18   Eulas Post, MD  ?cyclobenzaprine (FLEXERIL) 10 MG tablet Take 1 tablet (10 mg total) by mouth 3 (three) times daily as needed for muscle spasms. 04/07/20   Laurey Morale, MD  ?desoximetasone (TOPICORT) 0.25 % cream Apply 1 application topically 2 (two) times daily as needed. 09/26/15   Burchette, Alinda Sierras, MD  ?Fexofenadine HCl (ALLEGRA PO) Take by mouth.    [provider]  ?JARDIANCE 10 MG TABS tablet TAKE 1 TABLET BY MOUTH EVERY DAY BEFORE BREAKFAST 03/27/21   Burchette, Alinda Sierras, MD  ?lisinopril (ZESTRIL) 10 MG tablet TAKE 1 TABLET BY MOUTH EVERY DAY 12/08/20   Burchette, Alinda Sierras, MD  ?loratadine (CLARITIN) 10 MG tablet Take 10 mg by mouth daily. ?Patient not taking: No sig reported    [provider]  ?metFORMIN (GLUCOPHAGE) 500 MG tablet TAKE 1 TABLET BY MOUTH TWICE A DAY WITH A MEAL 09/07/21   Burchette, Alinda Sierras, MD  ?multivitamin-iron-minerals-folic acid (CENTRUM) chewable tablet Chew 1 tablet by mouth daily.      [provider]  ?Polyethylene Glycol 3350 (MIRALAX PO) Take by mouth.    [provider]  ?sildenafil (VIAGRA) 100 MG tablet TAKE 1/2 TO 1 TABLET BY MOUTH EVERY  DAY AS NEEDED FOR ERECTILE DYSFUNCTION 07/23/21   Burchette, Alinda Sierras, MD  ? ? ?Family History ?Family History  ?Problem Relation Age of Onset  ? Arthritis Mother   ? Arthritis Father   ? Dementia Father   ? Hypothyroidism Sister   ? Colon cancer Neg Hx   ? Esophageal cancer Neg Hx   ? Rectal cancer Neg Hx   ? Stomach cancer Neg Hx   ? Colon polyps Neg Hx   ? ? ?Social History ?Social History  ? ?Tobacco Use  ? Smoking status: Former  ?  Packs/day: 1.00  ?  Years: 30.00  ?  Pack years: 30.00  ?  Types: Cigarettes  ?  Quit date: 11/14/2017  ?  Years since quitting: 3.9  ? Smokeless tobacco: Never  ?Vaping Use  ? Vaping Use: Never used  ?Substance Use Topics  ? Alcohol use: No  ? Drug use: No  ? ? ? ?Allergies   ?Asa buff (mag  [buffered aspirin] ? ? ?Review of Systems ?Review of Systems  ?Constitutional:  Positive for appetite change, chills and fever. Negative for activity change, diaphoresis, fatigue and unexpected weight change.  ?HENT:  Positive for congestion, rhinorrhea and sore throat. Negative for dental problem, drooling, ear discharge, ear pain, facial swelling, hearing loss, mouth sores, nosebleeds, postnasal drip, sinus pressure, sinus pain, sneezing, tinnitus, trouble swallowing and voice change.   ?Respiratory:  Positive for cough. Negative for apnea, choking, chest tightness, shortness of breath, wheezing and stridor.   ?Cardiovascular: Negative.   ?Gastrointestinal: Negative.   ?Skin: Negative.   ?Neurological:  Positive for headaches. Negative for dizziness, tremors, seizures, syncope, facial asymmetry, speech difficulty, weakness, light-headedness and numbness.  ? ? ?Physical Exam ?Triage Vital Signs ?ED Triage Vitals  ?Enc Vitals Group  ?   BP 11/03/21 1259 135/87  ?   Pulse Rate 11/03/21 1259 (!) 114  ?   Resp 11/03/21 1259 20  ?   Temp 11/03/21 1259 98.4 ?F (36.9 ?C)  ?   Temp src --   ?   SpO2 11/03/21 1259 97 %  ?   Weight --   ?   Height --   ?   Head Circumference --   ?   Peak Flow --   ?   Pain Score 11/03/21 1257 0  ?   Pain Loc --   ?   Pain Edu? --   ?   Excl. in [place]? --   ? ?No data found. ? ?Updated Vital Signs ?BP 135/87   Pulse (!) 114   Temp 98.4 ?F (36.9 ?C)   Resp 20   SpO2 97%  ? ?Visual Acuity ?Right Eye Distance:   ?Left Eye Distance:   ?Bilateral Distance:   ? ?Right Eye Near:   ?Left Eye Near:    ?Bilateral Near:    ? ?Physical Exam ?Constitutional:   ?   Appearance: Normal appearance. He is ill-appearing.  ?HENT:  ?   Head: Normocephalic.  ?   Right Ear: Tympanic membrane, ear canal and external ear normal.  ?   Left Ear: Tympanic membrane, ear canal and external ear normal.  ?   Nose: Congestion and rhinorrhea present.  ?   Mouth/Throat:  ?   Mouth: Mucous membranes are moist.  ?   Pharynx:  Posterior oropharyngeal erythema present.  ?Eyes:  ?   Extraocular Movements: Extraocular movements intact.  ?Cardiovascular:  ?   Rate and Rhythm: Normal rate and regular rhythm.  ?  Pulses: Normal pulses.  ?   Heart sounds: Normal heart sounds.  ?Pulmonary:  ?   Effort: Pulmonary effort is normal.  ?   Breath sounds: Normal breath sounds.  ?Musculoskeletal:  ?   Cervical back: Normal range of motion and neck supple.  ?Skin: ?   General: Skin is warm and dry.  ?Neurological:  ?   Mental Status: He is alert and oriented to person, place, and time. Mental status is at baseline.  ?Psychiatric:     ?   Mood and Affect: Mood normal.     ?   Behavior: Behavior normal.  ? ? ? ?UC Treatments / Results  ?Labs ?(all labs ordered are listed, but only abnormal results are displayed) ?Labs Reviewed - No data to display ? ?EKG ? ? ?Radiology ?No results found. ? ?Procedures ?Procedures (including critical care time) ? ?Medications Ordered in UC ?Medications - No data to display ? ?Initial Impression / Assessment and Plan / UC Course  ?I have reviewed the triage vital signs and the nursing notes. ? ?Pertinent labs & imaging results that were available during my care of the patient were reviewed by me and considered in my medical decision making (see chart for details). ? ?COVID-19 ? ?Vital signs are stable, O2 saturation 97%, patient in no signs of distress, antiviral prescribed, discussed administration prescribed Mucinex as congestion is most worrisome symptom, recommended use of vitamins, increase fluid intake and food as tolerated to assist with fatigue as well resting, may attempt use of over-the-counter medications for additional support, urgent care follow-up as needed, work note given ?Final Clinical Impressions(s) / UC Diagnoses  ? ?Final diagnoses:  ?None  ? ?Discharge Instructions   ?None ?  ? ?ED Prescriptions   ?None ?  ? ?PDMP not reviewed this encounter. ?  ?Hans Eden, NP ?11/03/21 1357 ? ?

## 2021-11-03 NOTE — ED Triage Notes (Signed)
Pt reports he tested Positive for COVID at home Yesterday.Pt has a cough ,sore throat,HA and chills. Pt reports his Mother tested positive this week for COVID. Pt is requesting an antiviral med. ?

## 2021-11-03 NOTE — Discharge Instructions (Signed)
Covid iss a virus and should steadily improve in time ? ?Take the antiviral twice daily for the next 5 days, this medication is to help to reduce the severity and length of your illness ? ?Begin use of Mucinex taking 2 pills every morning and every evening to help reduce congestion ? ?To help with your fatigue increase your fluid intake to maintain hydration and to eat food as tolerated, you may attempt to use vitamins such as vitamins B12 vitamin C or elderberry to boost your immune system ?   ?You can take Tylenol and/or Ibuprofen as needed for fever reduction and pain relief. ?  ?For cough: honey 1/2 to 1 teaspoon (you can dilute the honey in water or another fluid).  You can also use dextromethorphan for cough. You can use a humidifier for chest congestion and cough.  If you don't have a humidifier, you can sit in the bathroom with the hot shower running.    ?  ?For sore throat: try warm salt water gargles, cepacol lozenges, throat spray, warm tea or water with lemon/honey, popsicles or ice, or OTC cold relief medicine for throat discomfort. ?  ?For congestion: take a daily anti-histamine like Zyrtec, Claritin, and a oral decongestant, such as pseudoephedrine.  You can also use Flonase 1-2 sprays in each nostril daily. ?  ?It is important to stay hydrated: drink plenty of fluids (water, gatorade/powerade/pedialyte, juices, or teas) to keep your throat moisturized and help further relieve irritation/discomfort.  ?

## 2021-11-07 ENCOUNTER — Encounter: Payer: 59 | Admitting: Family Medicine

## 2021-11-17 ENCOUNTER — Other Ambulatory Visit: Payer: Self-pay | Admitting: Family Medicine

## 2021-12-05 ENCOUNTER — Other Ambulatory Visit: Payer: Self-pay | Admitting: Family Medicine

## 2021-12-06 ENCOUNTER — Other Ambulatory Visit: Payer: Self-pay | Admitting: Family Medicine

## 2021-12-16 ENCOUNTER — Other Ambulatory Visit: Payer: Self-pay | Admitting: Family Medicine

## 2021-12-18 ENCOUNTER — Encounter: Payer: Self-pay | Admitting: Family Medicine

## 2021-12-18 ENCOUNTER — Ambulatory Visit (INDEPENDENT_AMBULATORY_CARE_PROVIDER_SITE_OTHER): Payer: 59 | Admitting: Family Medicine

## 2021-12-18 VITALS — BP 126/80 | HR 102 | Temp 97.4°F | Ht 70.47 in | Wt 256.2 lb

## 2021-12-18 DIAGNOSIS — E1165 Type 2 diabetes mellitus with hyperglycemia: Secondary | ICD-10-CM | POA: Diagnosis not present

## 2021-12-18 DIAGNOSIS — E1140 Type 2 diabetes mellitus with diabetic neuropathy, unspecified: Secondary | ICD-10-CM

## 2021-12-18 DIAGNOSIS — Z125 Encounter for screening for malignant neoplasm of prostate: Secondary | ICD-10-CM

## 2021-12-18 DIAGNOSIS — Z0000 Encounter for general adult medical examination without abnormal findings: Secondary | ICD-10-CM

## 2021-12-18 LAB — CBC WITH DIFFERENTIAL/PLATELET
Basophils Absolute: 0.1 10*3/uL (ref 0.0–0.1)
Basophils Relative: 0.9 % (ref 0.0–3.0)
Eosinophils Absolute: 0.3 10*3/uL (ref 0.0–0.7)
Eosinophils Relative: 3.3 % (ref 0.0–5.0)
HCT: 53.3 % — ABNORMAL HIGH (ref 39.0–52.0)
Hemoglobin: 17.4 g/dL — ABNORMAL HIGH (ref 13.0–17.0)
Lymphocytes Relative: 27.4 % (ref 12.0–46.0)
Lymphs Abs: 2.6 10*3/uL (ref 0.7–4.0)
MCHC: 32.6 g/dL (ref 30.0–36.0)
MCV: 90.5 fl (ref 78.0–100.0)
Monocytes Absolute: 0.6 10*3/uL (ref 0.1–1.0)
Monocytes Relative: 6.7 % (ref 3.0–12.0)
Neutro Abs: 5.8 10*3/uL (ref 1.4–7.7)
Neutrophils Relative %: 61.7 % (ref 43.0–77.0)
Platelets: 380 10*3/uL (ref 150.0–400.0)
RBC: 5.89 Mil/uL — ABNORMAL HIGH (ref 4.22–5.81)
RDW: 14 % (ref 11.5–15.5)
WBC: 9.4 10*3/uL (ref 4.0–10.5)

## 2021-12-18 LAB — LIPID PANEL
Cholesterol: 150 mg/dL (ref 0–200)
HDL: 37.2 mg/dL — ABNORMAL LOW (ref >39.00)
NonHDL: 112.84
Total CHOL/HDL Ratio: 4
Triglycerides: 226 mg/dL — ABNORMAL HIGH (ref 0.0–149.0)
VLDL: 45.2 mg/dL — ABNORMAL HIGH (ref 0.0–40.0)

## 2021-12-18 LAB — BASIC METABOLIC PANEL
BUN: 19 mg/dL (ref 6–23)
CO2: 25 mEq/L (ref 19–32)
Calcium: 9.5 mg/dL (ref 8.4–10.5)
Chloride: 100 mEq/L (ref 96–112)
Creatinine, Ser: 1.06 mg/dL (ref 0.40–1.50)
GFR: 78.56 mL/min (ref >60.00)
Glucose, Bld: 102 mg/dL — ABNORMAL HIGH (ref 70–99)
Potassium: 4.6 mEq/L (ref 3.5–5.1)
Sodium: 138 mEq/L (ref 135–145)

## 2021-12-18 LAB — HEMOGLOBIN A1C: Hgb A1c MFr Bld: 6.7 % — ABNORMAL HIGH (ref 4.6–6.5)

## 2021-12-18 LAB — HEPATIC FUNCTION PANEL
ALT: 23 U/L (ref 0–53)
AST: 23 U/L (ref 0–37)
Albumin: 4.3 g/dL (ref 3.5–5.2)
Alkaline Phosphatase: 59 U/L (ref 39–117)
Bilirubin, Direct: 0.2 mg/dL (ref 0.0–0.3)
Total Bilirubin: 1 mg/dL (ref 0.2–1.2)
Total Protein: 7.4 g/dL (ref 6.0–8.3)

## 2021-12-18 LAB — TSH: TSH: 0.66 u[IU]/mL (ref 0.35–5.50)

## 2021-12-18 LAB — MICROALBUMIN / CREATININE URINE RATIO
Creatinine,U: 77 mg/dL
Microalb Creat Ratio: 1.6 mg/g (ref 0.0–30.0)
Microalb, Ur: 1.2 mg/dL (ref 0.0–1.9)

## 2021-12-18 LAB — PSA: PSA: 1.3 ng/mL (ref 0.10–4.00)

## 2021-12-18 LAB — LDL CHOLESTEROL, DIRECT: Direct LDL: 99 mg/dL

## 2021-12-18 MED ORDER — METFORMIN HCL 500 MG PO TABS: 500.0000 mg | ORAL_TABLET | Freq: Two times a day (BID) | ORAL | 3 refills | Status: DC

## 2021-12-18 MED ORDER — SILDENAFIL CITRATE 100 MG PO TABS: ORAL_TABLET | ORAL | 6 refills | Status: DC

## 2021-12-18 MED ORDER — ATORVASTATIN CALCIUM 10 MG PO TABS: 10.0000 mg | ORAL_TABLET | Freq: Every day | ORAL | 3 refills | Status: DC

## 2021-12-18 MED ORDER — AMLODIPINE BESYLATE 5 MG PO TABS: 5.0000 mg | ORAL_TABLET | Freq: Every day | ORAL | 3 refills | Status: DC

## 2021-12-18 MED ORDER — LISINOPRIL 10 MG PO TABS: 10.0000 mg | ORAL_TABLET | Freq: Every day | ORAL | 3 refills | Status: DC

## 2021-12-18 MED ORDER — EMPAGLIFLOZIN 10 MG PO TABS: 10.0000 mg | ORAL_TABLET | Freq: Every day | ORAL | 3 refills | Status: DC

## 2021-12-18 NOTE — Progress Notes (Signed)
? ?Established Patient Office Visit ? ?Subjective   ?Patient ID: Gary Mora, male    DOB: [birth year]  Age: 56 y.o. MRN: [id number] ? ?Chief Complaint  ?Patient presents with  ? Annual Exam  ? ? ?HPI ? ? ?Here for complete physical.  Generally doing well.  He states he has started a diet plan.  Hopes to lose some weight this year.  Weight is actually down 3 pounds from last year.  He has history of chronic problems including hypertension, obstructive sleep apnea, obesity, type 2 diabetes with neuropathy, metabolic syndrome.  Compliant with medications.  Needs refills of all.  He does have a callus right great toe and also ganglion dorsum left foot and both of been painful at times.  He is agreeable to seeing a podiatrist. ? ?Last A1c was 7.3%.  Has not had this checked since July.  He has gotten regular eye exams most recently in January. ? ?Health maintenance reviewed: ? ?Health Maintenance  ?Topic Date Due  ? HIV Screening  Never done  ? COVID-19 Vaccine (3 - Booster for Pfizer series) 01/08/2020  ? HEMOGLOBIN A1C  08/24/2021  ? FOOT EXAM  02/21/2022  ? INFLUENZA VACCINE  03/19/2022  ? TETANUS/TDAP  08/24/2022  ? OPHTHALMOLOGY EXAM  08/29/2022  ? COLONOSCOPY (Pts 45-76yr Insurance coverage will need to be confirmed)  03/16/2026  ? Hepatitis C Screening  Completed  ? Zoster Vaccines- Shingrix  Completed  ? HPV VACCINES  Aged Out  ? ?Social history-divorced.  He has 2 children ages 121and 272  Quit smoking 5 years ago.  30-pack-year history.  History of binge alcohol drinking but no alcohol in 8 years now.  He works as an aPassenger transport managerfor a [employer] ? ?Family history reviewed.  No significant changes.  Father died of complications of dementia.  Both parents had degenerative arthritis.  He has 1 brother and 3 sisters.  One of his sisters has hypothyroidism.  No family history of premature CAD or type 2 diabetes. ? ?Past Medical History:  ?Diagnosis Date  ? Allergy   ? Chicken pox   ? Chronic kidney disease    ? kidney stone  ? Diabetes mellitus [place] complication ([place]   ? Headache(784.0)   ? tension after using the computer  ? Hypertension   ? Sleep apnea   ? Had surgery to correct no cpapa used  ? Substance abuse ([place]   ? ?Past Surgical History:  ?Procedure Laterality Date  ? COLONOSCOPY    ? 2018  ? MOUTH SURGERY    ? TONSILLECTOMY  2006  ? UVULOPALATOPHARYNGOPLASTY  2005  ? ? reports that he quit smoking about 4 years ago. His smoking use included cigarettes. He has a 30.00 pack-year smoking history. He has never used smokeless tobacco. He reports that he does not drink alcohol and does not use drugs. ?family history includes Arthritis in his father and mother; Dementia in his father; Hypothyroidism in his sister. ?Allergies  ?Allergen Reactions  ? Asa Buff (Mag [Buffered Aspirin]   ?  hives  ? ? ?Review of Systems  ?Constitutional:  Negative for weight loss.  ?Respiratory:  Negative for cough and shortness of breath.   ?Cardiovascular:  Negative for chest pain and leg swelling.  ?Gastrointestinal:  Negative for abdominal pain.  ?Genitourinary:  Negative for dysuria.  ?Neurological:  Negative for dizziness and weakness.  ?Psychiatric/Behavioral:  Negative for depression.   ? ?  ?Objective:  ?  ? ?BP 126/80 (BP  Location: Left Arm, Patient Position: Sitting, Cuff Size: Normal)   Pulse (!) 102   Temp (!) 97.4 ?F (36.3 ?C) (Oral)   Ht 5' 10.47" (1.79 m)   Wt 256 lb 3.2 oz (116.2 kg)   SpO2 95%   BMI 36.27 kg/m?  ? ? ?Physical Exam ?Constitutional:   ?   General: He is not in acute distress. ?   Appearance: He is well-developed.  ?HENT:  ?   Head: Normocephalic and atraumatic.  ?   Right Ear: External ear normal.  ?   Left Ear: External ear normal.  ?Eyes:  ?   Conjunctiva/sclera: Conjunctivae normal.  ?   Pupils: Pupils are equal, round, and reactive to light.  ?Neck:  ?   Thyroid: No thyromegaly.  ?Cardiovascular:  ?   Rate and Rhythm: Normal rate and regular rhythm.  ?   Heart sounds: Normal heart sounds. No  murmur heard. ?Pulmonary:  ?   Effort: No respiratory distress.  ?   Breath sounds: No wheezing or rales.  ?Abdominal:  ?   General: Bowel sounds are normal. There is no distension.  ?   Palpations: Abdomen is soft. There is no mass.  ?   Tenderness: There is no abdominal tenderness. There is no guarding or rebound.  ?Musculoskeletal:  ?   Cervical back: Normal range of motion and neck supple.  ?Lymphadenopathy:  ?   Cervical: No cervical adenopathy.  ?Skin: ?   Findings: No rash.  ?   Comments: Generally dry skin especially involving both heels.  He has mobile ganglion cyst dorsum left foot.  Right great toe reveals hardened callused area.  No ulceration visible.  ?Neurological:  ?   Mental Status: He is alert and oriented to person, place, and time.  ?   Cranial Nerves: No cranial nerve deficit.  ? ? ? ?No results found for any visits on 12/18/21. ? ? ? ?The 10-year ASCVD risk score (Arnett DK, et al., 2019) is: 13.6% ? ?  ?Assessment & Plan:  ? ?Problem List Items Addressed This Visit   ? ?  ? Unprioritized  ? Poorly controlled type 2 diabetes mellitus with neuropathy ([place])  ? Relevant Medications  ? atorvastatin (LIPITOR) 10 MG tablet  ? empagliflozin (JARDIANCE) 10 MG TABS tablet  ? lisinopril (ZESTRIL) 10 MG tablet  ? metFORMIN (GLUCOPHAGE) 500 MG tablet  ? Other Relevant Orders  ? Ambulatory referral to Podiatry  ? ?Other Visit Diagnoses   ? ? Physical exam    -  Primary  ? Relevant Orders  ? Basic metabolic panel  ? Lipid panel  ? CBC with Differential/Platelet  ? TSH  ? Hepatic function panel  ? Hemoglobin A1c  ? PSA  ? Microalbumin / creatinine urine ratio  ? ?  ?-Recommend setting up podiatry referral regarding his right great toe callus and also discussed ganglion left foot ? ?-Obtain screening labs as above ? ?-Continue yearly diabetic eye exam ? ?-Refill of medications for 1 year ? ?-Strongly encouraged him to lose significant amount of weight.  We would like to see his weight down below 220 pounds at  least. ? ?No follow-ups on file.  ? ? ?Carolann Littler, MD ? ?

## 2022-01-25 ENCOUNTER — Ambulatory Visit (INDEPENDENT_AMBULATORY_CARE_PROVIDER_SITE_OTHER): Payer: 59

## 2022-01-25 ENCOUNTER — Ambulatory Visit: Payer: 59

## 2022-01-25 ENCOUNTER — Ambulatory Visit: Payer: 59 | Admitting: Podiatry

## 2022-01-25 DIAGNOSIS — M21621 Bunionette of right foot: Secondary | ICD-10-CM | POA: Diagnosis not present

## 2022-01-25 DIAGNOSIS — E1149 Type 2 diabetes mellitus with other diabetic neurological complication: Secondary | ICD-10-CM

## 2022-01-25 DIAGNOSIS — M722 Plantar fascial fibromatosis: Secondary | ICD-10-CM

## 2022-01-25 DIAGNOSIS — S92345S Nondisplaced fracture of fourth metatarsal bone, left foot, sequela: Secondary | ICD-10-CM | POA: Diagnosis not present

## 2022-01-25 DIAGNOSIS — M79671 Pain in right foot: Secondary | ICD-10-CM

## 2022-01-25 DIAGNOSIS — M79672 Pain in left foot: Secondary | ICD-10-CM

## 2022-01-25 NOTE — Progress Notes (Signed)
SITUATION Reason for Consult: Evaluation for Bilateral Custom Foot Orthoses Patient / Caregiver Report: Patient is ready for foot orthotics  OBJECTIVE DATA: Patient History / Diagnosis:    ICD-10-CM   1. Bilateral foot pain  M79.671    M79.672       Current or Previous Devices:   None and no history  Foot Examination: Skin presentation:   Intact Ulcers & Callousing:   Callusing Toe / Foot Deformities:  Pes cavus Weight Bearing Presentation:  Cavus Sensation:    Compromised  Shoe Size:    66M  ORTHOTIC RECOMMENDATION Recommended Device: 1x pair of custom functional foot orthotics  GOALS OF ORTHOSES - Reduce Pain - Prevent Foot Deformity - Prevent Progression of Further Foot Deformity - Relieve Pressure - Improve the Overall Biomechanical Function of the Foot and Lower Extremity.  ACTIONS PERFORMED Potential out of pocket cost was communicated to patient. Patient understood and consent to casting. Patient was casted for Foot Orthoses via crush box. Procedure was explained and patient tolerated procedure well. Casts were shipped to central fabrication. All questions were answered and concerns addressed.  PLAN Patient is to be called for fitting when devices are ready.

## 2022-01-25 NOTE — Patient Instructions (Signed)
Diabetes Mellitus and Foot Care Foot care is an important part of your health, especially when you have diabetes. Diabetes may cause you to have problems because of poor blood flow (circulation) to your feet and legs, which can cause your skin to: Become thinner and drier. Break more easily. Heal more slowly. Peel and crack. You may also have nerve damage (neuropathy) in your legs and feet, causing decreased feeling in them. This means that you may not notice minor injuries to your feet that could lead to more serious problems. Noticing and addressing any potential problems early is the best way to prevent future foot problems. How to care for your feet Foot hygiene  Wash your feet daily with warm water and mild soap. Do not use hot water. Then, pat your feet and the areas between your toes until they are completely dry. Do not soak your feet as this can dry your skin. Trim your toenails straight across. Do not dig under them or around the cuticle. File the edges of your nails with an emery board or nail file. Apply a moisturizing lotion or petroleum jelly to the skin on your feet and to dry, brittle toenails. Use lotion that does not contain alcohol and is unscented. Do not apply lotion between your toes. Shoes and socks Wear clean socks or stockings every day. Make sure they are not too tight. Do not wear knee-high stockings since they may decrease blood flow to your legs. Wear shoes that fit properly and have enough cushioning. Always look in your shoes before you put them on to be sure there are no objects inside. To break in new shoes, wear them for just a few hours a day. This prevents injuries on your feet. Wounds, scrapes, corns, and calluses  Check your feet daily for blisters, cuts, bruises, sores, and redness. If you cannot see the bottom of your feet, use a mirror or ask someone for help. Do not cut corns or calluses or try to remove them with medicine. If you find a minor scrape,  cut, or break in the skin on your feet, keep it and the skin around it clean and dry. You may clean these areas with mild soap and water. Do not clean the area with peroxide, alcohol, or iodine. If you have a wound, scrape, corn, or callus on your foot, look at it several times a day to make sure it is healing and not infected. Check for: Redness, swelling, or pain. Fluid or blood. Warmth. Pus or a bad smell. General tips Do not cross your legs. This may decrease blood flow to your feet. Do not use heating pads or hot water bottles on your feet. They may burn your skin. If you have lost feeling in your feet or legs, you may not know this is happening until it is too late. Protect your feet from hot and cold by wearing shoes, such as at the beach or on hot pavement. Schedule a complete foot exam at least once a year (annually) or more often if you have foot problems. Report any cuts, sores, or bruises to your health care provider immediately. Where to find more information American Diabetes Association: www.diabetes.org Association of Diabetes Care & Education Specialists: www.diabeteseducator.org Contact a health care provider if: You have a medical condition that increases your risk of infection and you have any cuts, sores, or bruises on your feet. You have an injury that is not healing. You have redness on your legs or feet. You   feel burning or tingling in your legs or feet. You have pain or cramps in your legs and feet. Your legs or feet are numb. Your feet always feel cold. You have pain around any toenails. Get help right away if: You have a wound, scrape, corn, or callus on your foot and: You have pain, swelling, or redness that gets worse. You have fluid or blood coming from the wound, scrape, corn, or callus. Your wound, scrape, corn, or callus feels warm to the touch. You have pus or a bad smell coming from the wound, scrape, corn, or callus. You have a fever. You have a red  line going up your leg. Summary Check your feet every day for blisters, cuts, bruises, sores, and redness. Apply a moisturizing lotion or petroleum jelly to the skin on your feet and to dry, brittle toenails. Wear shoes that fit properly and have enough cushioning. If you have foot problems, report any cuts, sores, or bruises to your health care provider immediately. Schedule a complete foot exam at least once a year (annually) or more often if you have foot problems. This information is not intended to replace advice given to you by your health care provider. Make sure you discuss any questions you have with your health care provider. Document Revised: 02/24/2020 Document Reviewed: 02/24/2020 Elsevier Patient Education  2023 [employer]  

## 2022-01-27 NOTE — Progress Notes (Signed)
Subjective:   Patient ID: Gary Mora, male   DOB: 56 y.o.   MRN: [id number]   HPI 56 year old male presents the office today with concerns of bilateral foot discomfort.  He states he has had a cyst on the left foot previously but is not swollen at the fluid present currently.  He is given some numbness and sharp pain to his feet which started at 7 years ago.  He is diabetic and last A1c was 6.5.  Not sure of his last glucose.  This is started about a year ago.  He is interested in possible orthotics.  No recent injuries.   Review of Systems  All other systems reviewed and are negative.  Past Medical History:  Diagnosis Date   Allergy    Chicken pox    Chronic kidney disease    kidney stone   Diabetes mellitus without complication ([place])    IWOEHOZY(248.2)    tension after using the computer   Hypertension    Sleep apnea    Had surgery to correct no cpapa used   Substance abuse [facility])     Past Surgical History:  Procedure Laterality Date   COLONOSCOPY     2018   MOUTH SURGERY     TONSILLECTOMY  2006   UVULOPALATOPHARYNGOPLASTY  2005     Current Outpatient Medications:    amLODipine (NORVASC) 5 MG tablet, Take 1 tablet (5 mg total) by mouth daily., Disp: 90 tablet, Rfl: 3   Ascorbic Acid (VITAMIN C) 100 MG tablet, Take 100 mg by mouth daily.  , Disp: , Rfl:    atorvastatin (LIPITOR) 10 MG tablet, Take 1 tablet (10 mg total) by mouth daily., Disp: 90 tablet, Rfl: 3   desoximetasone (TOPICORT) 0.25 % cream, Apply 1 application topically 2 (two) times daily as needed., Disp: 30 g, Rfl: 2   empagliflozin (JARDIANCE) 10 MG TABS tablet, Take 1 tablet (10 mg total) by mouth daily., Disp: 90 tablet, Rfl: 3   Fexofenadine HCl (ALLEGRA PO), Take by mouth., Disp: , Rfl:    lisinopril (ZESTRIL) 10 MG tablet, Take 1 tablet (10 mg total) by mouth daily., Disp: 90 tablet, Rfl: 3   loratadine (CLARITIN) 10 MG tablet, Take 10 mg by mouth daily., Disp: , Rfl:    metFORMIN (GLUCOPHAGE) 500  MG tablet, Take 1 tablet (500 mg total) by mouth 2 (two) times daily with a meal., Disp: 180 tablet, Rfl: 3   multivitamin-iron-minerals-folic acid (CENTRUM) chewable tablet, Chew 1 tablet by mouth daily.  , Disp: , Rfl:    sildenafil (VIAGRA) 100 MG tablet, Take one tablet by mouth daily as needed., Disp: 10 tablet, Rfl: 6  Current Facility-Administered Medications:    0.9 %  sodium chloride infusion, 500 mL, Intravenous, Continuous, Pyrtle, Lajuan Lines, MD  Allergies  Allergen Reactions   Asa Buff (Mag [Buffered Aspirin]     hives          Objective:  Physical Exam  General: AAO x3, NAD  Dermatological: Skin is warm, dry and supple bilateral.  There are no open sores, no preulcerative lesions, no rash or signs of infection present.  Vascular: Dorsalis Pedis artery and Posterior Tibial artery pedal pulses are 2/4 bilateral with immedate capillary fill time. There is no pain with calf compression, swelling, warmth, erythema.   Neruologic: Mild decrease in sensation with Semmes Weinstein monofilament.    Musculoskeletal: Tailor's bunion present on the right foot.  No specific area pinpoint tenderness.  He does describe some  discomfort the arch along the plantar fascia.  Flexor, extensor tendons appear to be intact.  Muscular strength 5/5 in all groups tested bilateral.  Gait: Unassisted, Nonantalgic.       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  3 views were obtained bilaterally.  No evidence of acute fracture noted.  There is evidence of a subacute fracture noted on the left fourth metatarsal.  Patient is not aware of any injury.  There is bone callus formation. -Discussed the fracture appears to be subacute at this point not causing any  significant pain or swelling.  He does not recall an injury at all think he does have neuropathy.  Discussed wearing stiffer soled shoes.  I did not immobilize at this point as it is healing.  In general discussed daily foot inspection I do think he could benefit from orthotics to help aid in support and stability for his feet.  Trula Slade DPM

## 2022-02-10 ENCOUNTER — Other Ambulatory Visit: Payer: Self-pay | Admitting: Family Medicine

## 2022-03-22 ENCOUNTER — Ambulatory Visit: Payer: 59 | Admitting: Podiatry

## 2022-03-22 ENCOUNTER — Ambulatory Visit (INDEPENDENT_AMBULATORY_CARE_PROVIDER_SITE_OTHER): Payer: 59

## 2022-03-22 ENCOUNTER — Other Ambulatory Visit: Payer: 59

## 2022-03-22 DIAGNOSIS — E559 Vitamin D deficiency, unspecified: Secondary | ICD-10-CM | POA: Diagnosis not present

## 2022-03-22 DIAGNOSIS — M79672 Pain in left foot: Secondary | ICD-10-CM | POA: Diagnosis not present

## 2022-03-22 DIAGNOSIS — E1149 Type 2 diabetes mellitus with other diabetic neurological complication: Secondary | ICD-10-CM

## 2022-03-22 DIAGNOSIS — M722 Plantar fascial fibromatosis: Secondary | ICD-10-CM

## 2022-03-22 DIAGNOSIS — S92345S Nondisplaced fracture of fourth metatarsal bone, left foot, sequela: Secondary | ICD-10-CM | POA: Diagnosis not present

## 2022-03-25 NOTE — Progress Notes (Signed)
Subjective: Chief Complaint  Patient presents with   Foot Injury    Left foot fracture, Pt deines any pain, doing well     56 year old male presents with above complaint.  He states he is doing.  Again he does not recall any injury when sure what caused the fracture.  No swelling.  No motor shoe gear.  Also presents to pick up orthotics.  No other concerns.  Objective: AAO x3, NAD DP/PT pulses palpable bilaterally, CRT less than 3 seconds Unable to elicit any area of there is no increase in edema or erythema.  Mobile soft tissue mass consistent with ganglion on the dorsal aspect of foot.  This is along the second metatarsal cuneiform joint.  In particular there is no pain on the area of the fourth metatarsal.  MMT 5/5. No pain with calf compression, swelling, warmth, erythema  Assessment: Subacute fracture left foot  Plan: -All treatment options discussed with the patient including all alternatives, risks, complications.  -X-rays obtained reviewed of the left foot.  3 views were obtained.  There is no evidence of acute fracture but there is likely chronic fracture of the fourth metatarsal. -Not sure how old the fracture is examined no pain with this.  Recommend stiffer soled shoes.  We will check a vitamin D level. -Orthotics dispensed today and oral/written break-in instructions provided.  -Patient encouraged to call the office with any questions, concerns, change in symptoms.   Trula Slade DPM

## 2022-04-12 ENCOUNTER — Telehealth: Payer: Self-pay | Admitting: Family Medicine

## 2022-04-12 NOTE — Telephone Encounter (Signed)
Pharmacy is saying they don't have the prescription for sildenafil (VIAGRA) 100 MG tablet  [id number] [place], [place] - [address]:  [phone] Fax:  [phone]

## 2022-04-12 NOTE — Telephone Encounter (Signed)
I spoke with the pharmacy and they stated rx is ready for pickup and patient is aware

## 2022-07-01 ENCOUNTER — Encounter: Payer: Self-pay | Admitting: Family Medicine

## 2022-07-01 ENCOUNTER — Ambulatory Visit: Payer: 59 | Admitting: Family Medicine

## 2022-07-01 VITALS — BP 110/60 | HR 111 | Temp 97.8°F | Ht 70.5 in | Wt 250.0 lb

## 2022-07-01 DIAGNOSIS — R1032 Left lower quadrant pain: Secondary | ICD-10-CM

## 2022-07-01 DIAGNOSIS — M1812 Unilateral primary osteoarthritis of first carpometacarpal joint, left hand: Secondary | ICD-10-CM | POA: Diagnosis not present

## 2022-07-01 MED ORDER — CIPROFLOXACIN HCL 500 MG PO TABS: 500.0000 mg | ORAL_TABLET | Freq: Two times a day (BID) | ORAL | 0 refills | Status: AC

## 2022-07-01 MED ORDER — METRONIDAZOLE 500 MG PO TABS: 500.0000 mg | ORAL_TABLET | Freq: Three times a day (TID) | ORAL | 0 refills | Status: AC

## 2022-07-01 NOTE — Progress Notes (Signed)
Established Patient Office Visit  Subjective   Patient ID: Gary Mora, male    DOB: [birth year]  Age: 56 y.o. MRN: [id number]  Chief Complaint  Patient presents with   Abdominal Pain    HPI   Burns is seen with abdominal pain left lower quadrant which started last Friday night.  This continued over the weekend and at times abdominal pain was quite severe.  He did have some loose stools with blood noted which is relatively bright.  Symptoms are some improved now.  He has been no acute down fluids but very poor appetite.  He ate half a sandwich today at lunch.  He estimates he is lost 10 pounds.  No fever.  No nausea or vomiting.  He relates prior history of diverticular disease.  He had colonoscopy 7/22 with diverticulosis noted sigmoid colon.  No fever today.  No abdominal distention.  No history of ischemic colitis.  Left thumb pain.  Pain location is CMC joint.  He noticed some swelling intermittently.  Denies any recent injury.  Does use various braces in the past with minimal relief.  Past Medical History:  Diagnosis Date   Allergy    Chicken pox    Chronic kidney disease    kidney stone   Diabetes mellitus without complication ([place])    WYOVZCHY(850.2)    tension after using the computer   Hypertension    Sleep apnea    Had surgery to correct no cpapa used   Substance abuse [facility])    Past Surgical History:  Procedure Laterality Date   COLONOSCOPY     2018   MOUTH SURGERY     TONSILLECTOMY  2006   UVULOPALATOPHARYNGOPLASTY  2005    reports that he quit smoking about 4 years ago. His smoking use included cigarettes. He has a 30.00 pack-year smoking history. He has never used smokeless tobacco. He reports that he does not drink alcohol and does not use drugs. family history includes Arthritis in his father and mother; Dementia in his father; Hypothyroidism in his sister. Allergies  Allergen Reactions   Asa Buff (Mag [Buffered Aspirin]     hives    Review of Systems   Constitutional:  Negative for chills, fever and weight loss.  Respiratory:  Negative for shortness of breath.   Cardiovascular:  Negative for chest pain.  Gastrointestinal:  Positive for abdominal pain, blood in stool and diarrhea. Negative for melena, nausea and vomiting.  Genitourinary:  Negative for dysuria.  Neurological:  Negative for dizziness.      Objective:     BP 110/60   Pulse (!) 111   Temp 97.8 F (36.6 C) (Oral)   Ht 5' 10.5" (1.791 m)   Wt 250 lb (113.4 kg)   SpO2 95%   BMI 35.36 kg/m    Physical Exam Vitals reviewed.  Constitutional:      Appearance: He is obese.  Cardiovascular:     Rate and Rhythm: Normal rate and regular rhythm.  Pulmonary:     Effort: Pulmonary effort is normal.     Breath sounds: Normal breath sounds.  Abdominal:     General: Bowel sounds are normal. There is no distension.     Palpations: Abdomen is soft.     Comments: Abdomen is soft.  He has some mild tenderness left lower quadrant.  No guarding or rebound.  No masses noted.  Musculoskeletal:     Comments: Left thumb reveals some mild tenderness on the [facility] joint.  No erythema.  No ecchymosis.  Full range of motion.  Neurological:     Mental Status: He is alert.      No results found for any visits on 07/01/22.    The 10-year ASCVD risk score (Arnett DK, et al., 2019) is: 9.7%    Assessment & Plan:   #1 onset a few days ago of left lower quadrant abdominal pain.  He did describe some bloody stools which have not occurred any today.  His pain is some improved.  Differential is ischemic colitis versus acute diverticulitis.  Nonacute abdomen. -Recommend CBC with differential.  Our lab was already closed so he will return early tomorrow morning for that -His symptoms are improving.  We did discuss possible CT scan especially if he has any recurrent severe pain but this point symptoms seem to be gradually improved some -We did discuss covering with Cipro 500 mg twice daily  and Flagyl 500 mg 3 times daily -Follow-up immediately for any fever or worsening abdominal pain  #2 osteoarthritis left CMC joint.  Strict avoidance of nonsteroidals.  Try Tylenol if anything for pain   No follow-ups on file.    Carolann Littler, MD

## 2022-07-02 ENCOUNTER — Other Ambulatory Visit (INDEPENDENT_AMBULATORY_CARE_PROVIDER_SITE_OTHER): Payer: 59

## 2022-07-02 DIAGNOSIS — R1032 Left lower quadrant pain: Secondary | ICD-10-CM | POA: Diagnosis not present

## 2022-07-02 LAB — CBC WITH DIFFERENTIAL/PLATELET
Basophils Absolute: 0.1 10*3/uL (ref 0.0–0.1)
Basophils Relative: 1 % (ref 0.0–3.0)
Eosinophils Absolute: 0.3 10*3/uL (ref 0.0–0.7)
Eosinophils Relative: 3.2 % (ref 0.0–5.0)
HCT: 52.7 % — ABNORMAL HIGH (ref 39.0–52.0)
Hemoglobin: 17.4 g/dL — ABNORMAL HIGH (ref 13.0–17.0)
Lymphocytes Relative: 18.3 % (ref 12.0–46.0)
Lymphs Abs: 1.9 10*3/uL (ref 0.7–4.0)
MCHC: 33 g/dL (ref 30.0–36.0)
MCV: 90.6 fl (ref 78.0–100.0)
Monocytes Absolute: 0.8 10*3/uL (ref 0.1–1.0)
Monocytes Relative: 7.8 % (ref 3.0–12.0)
Neutro Abs: 7.3 10*3/uL (ref 1.4–7.7)
Neutrophils Relative %: 69.7 % (ref 43.0–77.0)
Platelets: 334 10*3/uL (ref 150.0–400.0)
RBC: 5.82 Mil/uL — ABNORMAL HIGH (ref 4.22–5.81)
RDW: 13.9 % (ref 11.5–15.5)
WBC: 10.4 10*3/uL (ref 4.0–10.5)

## 2022-07-15 ENCOUNTER — Ambulatory Visit: Payer: 59 | Admitting: Family Medicine

## 2022-07-15 ENCOUNTER — Encounter: Payer: Self-pay | Admitting: Family Medicine

## 2022-07-15 VITALS — BP 102/60 | HR 100 | Temp 97.7°F | Ht 70.5 in | Wt 252.1 lb

## 2022-07-15 DIAGNOSIS — E1140 Type 2 diabetes mellitus with diabetic neuropathy, unspecified: Secondary | ICD-10-CM

## 2022-07-15 DIAGNOSIS — L02611 Cutaneous abscess of right foot: Secondary | ICD-10-CM | POA: Diagnosis not present

## 2022-07-15 DIAGNOSIS — E1165 Type 2 diabetes mellitus with hyperglycemia: Secondary | ICD-10-CM

## 2022-07-15 LAB — POCT GLYCOSYLATED HEMOGLOBIN (HGB A1C): Hemoglobin A1C: 6.8 % — AB (ref 4.0–5.6)

## 2022-07-15 NOTE — Patient Instructions (Signed)
Finish out the Clindamycin  Watch for any fever or increased redness  I have placed referral back to Dr Jacqualyn Posey.

## 2022-07-15 NOTE — Progress Notes (Signed)
Established Patient Office Visit  Subjective   Patient ID: Gary Mora, male    DOB: January 20, 1966  Age: 56 y.o. MRN: [id number]  Chief Complaint  Patient presents with   Toe Pain    Patient states he filed callouses from the right great toe 4 days ago, states he noticed right calf pain, drainage and fever the same night, next day he noticed swelling and redness, seen at [facility] 3 days ago and diagnosed with cellulits, given Rocephin injection and Rx for Clindamycin (to take a total of '450mg'$  three times a day for 7 days)    HPI   Patient has history of hypertension, type 2 diabetes with peripheral neuropathy, metabolic syndrome, obstructive sleep apnea.  Seen with recent abscess and cellulitis right great toe.  He states last Thursday morning he was trying to file down an existing hard callus on the right great toe.  By Thursday night he noticed some fever and chills and increased redness of the right great toe.  By Friday morning the redness had progressed and he went to local urgent care and was given Rocephin 1 g IM and started on clindamycin 450 mg 3 times daily for 7 days.  No further fever.  He has been soaking this in Epsom salt soaks twice per day.  Type 2 diabetes and not monitoring blood sugars regularly.  He takes Jardiance and metformin regularly.  Last A1c 6.7% last May.  Past Medical History:  Diagnosis Date   Allergy    Chicken pox    Chronic kidney disease    kidney stone   Diabetes mellitus without complication ([place])    LKGMWNUU(725.3)    tension after using the computer   Hypertension    Sleep apnea    Had surgery to correct no cpapa used   Substance abuse [facility])    Past Surgical History:  Procedure Laterality Date   COLONOSCOPY     2018   MOUTH SURGERY     TONSILLECTOMY  2006   UVULOPALATOPHARYNGOPLASTY  2005    reports that he quit smoking about 4 years ago. His smoking use included cigarettes. He has a 30.00 pack-year smoking history. He has never used  smokeless tobacco. He reports that he does not drink alcohol and does not use drugs. family history includes Arthritis in his father and mother; Dementia in his father; Hypothyroidism in his sister. Allergies  Allergen Reactions   Asa Buff (Mag [Buffered Aspirin]     hives    Review of Systems  Constitutional:  Negative for chills and fever.  Gastrointestinal:  Negative for nausea and vomiting.      Objective:     BP 102/60 (BP Location: Left Arm, Patient Position: Sitting, Cuff Size: Large)   Pulse 100   Temp 97.7 F (36.5 C) (Oral)   Ht 5' 10.5" (1.791 m)   Wt 252 lb 1.6 oz (114.4 kg)   SpO2 99%   BMI 35.66 kg/m    Physical Exam Vitals reviewed.  Cardiovascular:     Rate and Rhythm: Normal rate and regular rhythm.  Skin:    Comments: Right great toe reveals hard callused area on the volar surface near the base.  He has some mild surrounding erythema but this looks much improved compared to picture he showed from a couple days ago.  No fluctuance.  No significant warmth.  No foul odor. Does have a small amount of purulent liquid draining to the surface with pressure applied just lateral to  the callused region  Neurological:     Mental Status: He is alert.      Results for orders placed or performed in visit on 07/15/22  POC HgB A1c  Result Value Ref Range   Hemoglobin A1C 6.8 (A) 4.0 - 5.6 %   HbA1c POC (<> result, manual entry)     HbA1c, POC (prediabetic range)     HbA1c, POC (controlled diabetic range)        The 10-year ASCVD risk score (Arnett DK, et al., 2019) is: 8.5%    Assessment & Plan:   Problem List Items Addressed This Visit       Unprioritized   Poorly controlled type 2 diabetes mellitus with neuropathy ([place])   Relevant Orders   POC HgB A1c (Completed)   Other Visit Diagnoses     Abscess of great toe of right foot    -  Primary   Relevant Orders   Ambulatory referral to Podiatry     Patient presents with abscess and cellulitis  right great toe in setting of type 2 diabetes with peripheral neuropathy.  Suspect he was developing early abscess underneath hard callused area.  Cellulitis changes appear to be improving following Rocephin and on clindamycin.  A1c today stable at 6.8%  -Set up podiatry referral to Dr. Earleen Newport whom he has seen in the past.  Will likely need further trimming and debridement of callus -Finish out clindamycin -Continue Epsom salt warm water soaks twice daily -Follow-up immediately for any fever or recurrent/worsening redness  No follow-ups on file.    Carolann Littler, MD

## 2022-07-18 ENCOUNTER — Ambulatory Visit: Payer: 59 | Admitting: Podiatry

## 2022-07-18 DIAGNOSIS — L03031 Cellulitis of right toe: Secondary | ICD-10-CM

## 2022-07-18 DIAGNOSIS — E1142 Type 2 diabetes mellitus with diabetic polyneuropathy: Secondary | ICD-10-CM

## 2022-07-18 DIAGNOSIS — L02611 Cutaneous abscess of right foot: Secondary | ICD-10-CM | POA: Diagnosis not present

## 2022-07-18 DIAGNOSIS — L97512 Non-pressure chronic ulcer of other part of right foot with fat layer exposed: Secondary | ICD-10-CM

## 2022-07-18 MED ORDER — CLINDAMYCIN HCL 300 MG PO CAPS: 300.0000 mg | ORAL_CAPSULE | Freq: Three times a day (TID) | ORAL | 0 refills | Status: AC

## 2022-07-18 NOTE — Progress Notes (Signed)
Subjective:  Patient ID: Gary Mora, male    DOB: 04/16/1966,  MRN: [id number]  Chief Complaint  Patient presents with   Callouses    Patient is here for callous on right foot great toe that got infected, the [facility] and was referred over to see podiatry.    56 y.o. male presents with concern for right hallux callus and prior infection.  He states he had a callus that got infected and started draining approximately 1 week ago.  He went to an urgent care they placed him on antibiotics and gave him an IV antibiotic shot as well.  He says that since he has been taking the antibiotics 3 times a day he has noted significant improvement with decreased redness swelling and drainage in the right foot.  He does have a history of diabetes.  Also has a history of peripheral neuropathy.  Denies any nausea vomiting fever chills.  Past Medical History:  Diagnosis Date   Allergy    Chicken pox    Chronic kidney disease    kidney stone   Diabetes mellitus without complication ([facility])    BZJIRCVE(938.1)    tension after using the computer   Hypertension    Sleep apnea    Had surgery to correct no cpapa used   Substance abuse ([facility])     Allergies  Allergen Reactions   Asa Buff (Mag [Buffered Aspirin]     hives    ROS: Negative except as per HPI above  Objective:  General: AAO x3, NAD  Dermatological: Please see pre and postdebridement pictures below.  At the medial plantar aspect of the hallux IPJ there is a large hyperkeratotic lesion with underlying ulceration present that probes to the depth of subcutaneous fat tissue.  There is some seropurulent drainage present underlying callus upon debridement.  This was cultured.  The wound bed postdebridement does appear to be healthy with minimal fibrotic tissue.  There is mild erythema of the right hallux however decreased from prior per the patient.  Mild edema wound does not probe deep to bone.  Vascular:  Dorsalis Pedis artery  and Posterior Tibial artery pedal pulses are nonpalpable on the right lower extremity capillary fill time < 3 sec to all digits.   Neruologic: Grossly intact via light touch bilateral. Protective threshold intact to all sites bilateral.   Musculoskeletal: No gross boney pedal deformities bilateral. No pain, crepitus, or limitation noted with foot and ankle range of motion bilateral. Muscular strength 5/5 in all groups tested bilateral.  Gait: Unassisted, Nonantalgic.        Radiographs: Deferred Assessment:   1. 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 Cellulitis and abscess of toe, right   3. DM type 2 with diabetic peripheral neuropathy ([place])      Plan:  Patient was evaluated and treated and all questions answered.  #Ulceration of the right plantar medial hallux with overlying hyperkeratotic tissue and mild cellulitis of the right hallux -We discussed the etiology and factors that are a part of the wound healing process.  We also discussed the risk of infection both soft tissue and osteomyelitis from open ulceration.  Discussed the risk of limb loss if this happens or worsens. -Debridement as below. -Dressed with Betadine gauze, DSD. -Continue home dressing changes daily with Betadine and dry gauze dressing -Continue off-loading with surgical shoe. -Vascular testing yes.  Patient sent for ABI PVR testing of the right  lower extremity related to diminished pedal pulses in the setting of an ulceration on his right hallux -HgbA1c: Last 6.8 on July 15, 2022 -Last antibiotics: Clindamycin 300 mg 3 times daily.  I will send another prescription for another week of clindamycin 300 mg 3 times daily as the cellulitis has been responding to this.  I also took a wound culture of the drainage underlying the callus at this visit will adjust antibiotics accordingly. -Imaging: Deferred at this visit as the ulceration appears very superficial no evidence of underlying  deep infection  Procedure: Excisional Debridement of Wound Rationale: Removal of non-viable soft tissue from the wound to promote healing.  Anesthesia: none Post-Debridement Wound Measurements: 1.5 cm x 1.5 cm x 0.2 cm  Type of Debridement: Sharp Excisional with 15 blade scalpel Tissue Removed: Non-viable soft tissue Depth of Debridement: subcutaneous tissue. Technique: Sharp excisional debridement to bleeding, viable wound base.  Dressing: Dry, sterile, compression dressing. Disposition: Patient tolerated procedure well.   Return in about 3 weeks (around 08/08/2022).             Everitt Amber, DPM [facility]

## 2022-07-19 ENCOUNTER — Ambulatory Visit (HOSPITAL_COMMUNITY)
Admission: RE | Admit: 2022-07-19 | Discharge: 2022-07-19 | Disposition: A | Discharge status: Home / Self Care | Payer: 59 | Source: Ambulatory Visit | Attending: Podiatry | Admitting: Podiatry

## 2022-07-19 DIAGNOSIS — L97512 Non-pressure chronic ulcer of other part of right foot with fat layer exposed: Secondary | ICD-10-CM | POA: Diagnosis not present

## 2022-07-21 LAB — WOUND CULTURE
MICRO NUMBER:: 14252324
SPECIMEN QUALITY:: ADEQUATE

## 2022-08-08 ENCOUNTER — Ambulatory Visit: Payer: 59 | Admitting: Podiatry

## 2022-08-08 DIAGNOSIS — L02611 Cutaneous abscess of right foot: Secondary | ICD-10-CM

## 2022-08-08 DIAGNOSIS — L97512 Non-pressure chronic ulcer of other part of right foot with fat layer exposed: Secondary | ICD-10-CM | POA: Diagnosis not present

## 2022-08-08 DIAGNOSIS — E1142 Type 2 diabetes mellitus with diabetic polyneuropathy: Secondary | ICD-10-CM

## 2022-08-08 DIAGNOSIS — L03031 Cellulitis of right toe: Secondary | ICD-10-CM

## 2022-08-08 NOTE — Progress Notes (Signed)
Subjective:  Patient ID: Gary Mora, male    DOB: October 05, 1965,  MRN: [id number]  Chief Complaint  Patient presents with   Follow-up    R ulcer 3 wks f/u, no drainage, patient stated getting better     56 y.o. male presents with follow up R hallux ulcer.  He states that the ulcer seems to have been doing better since last appointment.  He says the redness has decreased in the great toe.  He says the size of the wound is also decreased.  He is stopped doing dressing changes as he thought that they were needed provided to no drainage.  Has finished course of clindamycin.  Also had vascular testing done which was showed normal arterial flow to the right foot.  Says pain is decreased in the right great toe as well.  Past Medical History:  Diagnosis Date   Allergy    Chicken pox    Chronic kidney disease    kidney stone   Diabetes mellitus without complication ([facility])    YQMVHQIO(962.9)    tension after using the computer   Hypertension    Sleep apnea    Had surgery to correct no cpapa used   Substance abuse ([facility])     Allergies  Allergen Reactions   Asa Buff (Mag [Buffered Aspirin]     hives    ROS: Negative except as per HPI above  Objective:  General: AAO x3, NAD  Dermatological: Ulceration at the plantar aspect of the right hallux decreased in size from prior exam.  Now measures approximately 0.3 x 0.5 x 0.2 cm much improved with decreased hyperkeratotic tissue.  There is still some maceration of the surrounding border.  Decreased erythema and edema of the right hallux.  Vascular:  Dorsalis Pedis artery and Posterior Tibial artery pedal pulses are nonpalpable on the right lower extremity capillary fill time < 3 sec to all digits.   Neruologic: Grossly intact via light touch bilateral. Protective threshold intact to all sites bilateral.   Musculoskeletal: No gross boney pedal deformities bilateral. No pain, crepitus, or limitation noted with foot and ankle range of motion  bilateral. Muscular strength 5/5 in all groups tested bilateral.  Gait: Unassisted, Nonantalgic.     Radiographs: Deferred Assessment:   1. 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 Cellulitis and abscess of toe, right   3. DM type 2 with diabetic peripheral neuropathy ([place])       Plan:  Patient was evaluated and treated and all questions answered.  #Ulceration of the right plantar medial hallux with overlying hyperkeratotic tissue , decreased size improving with local wound care.  Infection seems to be resolved after oral antibiotic -We discussed the etiology and factors that are a part of the wound healing process.  We also discussed the risk of infection both soft tissue and osteomyelitis from open ulceration.  Discussed the risk of limb loss if this happens or worsens. -Debridement as below. -Dressed with Betadine gauze, DSD. -Continue home dressing changes daily with Betadine and dry gauze dressing the wound is completely healed -Continue off-loading with surgical shoe. -Vascular testing completed and showed normal arterial flow to the right foot. -HgbA1c: Last 6.8 on July 15, 2022 -Last antibiotics: Clindamycin 300 mg 3 times daily course completed no further antibiotics indicated -Imaging: Deferred at this visit as the ulceration appears very superficial no evidence of underlying deep infection  Procedure: Excisional Debridement of Wound Rationale: Removal of non-viable soft  tissue from the wound to promote healing.  Anesthesia: none Post-Debridement Wound Measurements: 0.3cm x 0.5 cm x 0.2 cm  Type of Debridement: Sharp Excisional with 15 blade scalpel Tissue Removed: Non-viable soft tissue Depth of Debridement: subcutaneous tissue. Technique: Sharp excisional debridement to bleeding, viable wound base.  Dressing: Dry, sterile, compression dressing. Disposition: Patient tolerated procedure well.   Return in about 3 weeks (around  08/29/2022).             Everitt Amber, DPM [facility]

## 2022-08-29 ENCOUNTER — Ambulatory Visit: Payer: 59 | Admitting: Podiatry

## 2022-08-29 ENCOUNTER — Encounter: Payer: Self-pay | Admitting: Podiatry

## 2022-08-29 VITALS — BP 120/65 | HR 71

## 2022-08-29 DIAGNOSIS — L97512 Non-pressure chronic ulcer of other part of right foot with fat layer exposed: Secondary | ICD-10-CM

## 2022-08-29 NOTE — Progress Notes (Signed)
  Subjective:  Patient ID: EDUAR KUMPF, male    DOB: 06/14/1966,  MRN: [id number]  Chief Complaint  Patient presents with   Foot Ulcer    Right foot ulcer right great toe, patient states that it is healing well.    57 y.o. male presents with follow up R hallux ulcer.  He has been putting betadine and band aid. Thinks wound has healed up. No drainage redness or swelling.   Past Medical History:  Diagnosis Date   Allergy    Chicken pox    Chronic kidney disease    kidney stone   Diabetes mellitus without complication (HCC)    EZMOQHUT(654.6)    tension after using the computer   Hypertension    Sleep apnea    Had surgery to correct no cpapa used   Substance abuse (HCC)     Allergies  Allergen Reactions   Asa Buff (Mag [Buffered Aspirin]     hives    ROS: Negative except as per HPI above  Objective:  General: AAO x3, NAD  Dermatological: Healed ulcer plantar aspect right hallux, eschar dry and stable with surrounding hyperkeratotic tissue.     Vascular:  Dorsalis Pedis artery and Posterior Tibial artery pedal pulses are nonpalpable on the right lower extremity capillary fill time < 3 sec to all digits.   Neruologic: Grossly intact via light touch bilateral. Protective threshold intact to all sites bilateral.   Musculoskeletal: No gross boney pedal deformities bilateral. No pain, crepitus, or limitation noted with foot and ankle range of motion bilateral. Muscular strength 5/5 in all groups tested bilateral.  Gait: Unassisted, Nonantalgic.   Radiographs: Deferred Assessment:   1. 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  #Ulceration of the right plantar medial hallux with overlying hyperkeratotic tissue , Healed -At this point time the ulceration is fully healed much improved from last visit.  There is no open area.  There is a small central eschar with dry  surrounding hyperkeratotic tissue. -Performed courtesy light debridement of the hyperkeratotic tissue surrounding. -encourage use of custom-made orthotics and offloading to prevent pressure on the area of the prior ulceration -Patient will continue to monitor and call for comes back.  Return if symptoms worsen or fail to improve.             Everitt Amber, DPM [facility]

## 2022-11-29 ENCOUNTER — Encounter: Payer: Self-pay | Admitting: Podiatry

## 2022-11-29 ENCOUNTER — Ambulatory Visit: Payer: 59 | Admitting: Podiatry

## 2022-11-29 DIAGNOSIS — E1142 Type 2 diabetes mellitus with diabetic polyneuropathy: Secondary | ICD-10-CM

## 2022-11-29 DIAGNOSIS — L84 Corns and callosities: Secondary | ICD-10-CM | POA: Diagnosis not present

## 2022-11-29 DIAGNOSIS — Z87898 Personal history of other specified conditions: Secondary | ICD-10-CM | POA: Diagnosis not present

## 2022-11-29 NOTE — Progress Notes (Signed)
  Subjective:  Patient ID: Gary Mora, male    DOB: June 14, 1966,   MRN: [id number]  Chief Complaint  Patient presents with   Ulcer    Right foot ulcer     57 y.o. male presents for concern of right great toe wound. Had been following with Dr. Annamary Rummage and relates it has healed up. Relates callus has returned and getting pain in the toe and concerned for reoccurrence.  Patient is diabetic and last A1c was 6.8 . Denies any other pedal complaints. Denies n/v/f/c.   PCP: Evelena Peat MD  Last seen. 07/15/22  Past Medical History:  Diagnosis Date   Allergy    Chicken pox    Chronic kidney disease    kidney stone   Diabetes mellitus without complication    Headache(784.0)    tension after using the computer   Hypertension    Sleep apnea    Had surgery to correct no cpapa used   Substance abuse     Objective:  Physical Exam: Vascular: DP/PT pulses 2/4 bilateral. CFT <3 seconds. Normal hair growth on digits. No edema.  Skin. No lacerations or abrasions bilateral feet. Hyperkeratotic lesion noted medial plantar right hallux. No open ulceration.  Musculoskeletal: MMT 5/5 bilateral lower extremities in DF, PF, Inversion and Eversion. Deceased ROM in DF of ankle joint.  Neurological: Sensation intact to light touch.   Assessment:   1. Pre-ulcerative calluses   2. History of ulceration   3. DM type 2 with diabetic peripheral neuropathy      Plan:  Patient was evaluated and treated and all questions answered. -Discussed and educated patient on diabetic foot care, especially with  regards to the vascular, neurological and musculoskeletal systems.  -Stressed the importance of good glycemic control and the detriment of not  controlling glucose levels in relation to the foot. -Discussed supportive shoes at all times and checking feet regularly.  -Mechanically debrided hyperkeartotic tissue with chisel without incident.  -Answered all patient questions -Patient to return  in 3  months for at risk foot care -Patient advised to call the office if any problems or questions arise in the meantime.   Louann Sjogren, DPM

## 2022-12-24 ENCOUNTER — Other Ambulatory Visit: Payer: Self-pay | Admitting: Family Medicine

## 2023-01-13 ENCOUNTER — Other Ambulatory Visit: Payer: Self-pay | Admitting: Family Medicine

## 2023-02-01 ENCOUNTER — Other Ambulatory Visit: Payer: Self-pay | Admitting: Family Medicine

## 2023-02-17 ENCOUNTER — Other Ambulatory Visit: Payer: Self-pay | Admitting: Family Medicine

## 2023-02-19 ENCOUNTER — Other Ambulatory Visit: Payer: Self-pay | Admitting: Family Medicine

## 2023-03-15 ENCOUNTER — Other Ambulatory Visit: Payer: Self-pay | Admitting: Family Medicine

## 2023-04-01 ENCOUNTER — Ambulatory Visit: Payer: 59 | Admitting: Family Medicine

## 2023-04-02 ENCOUNTER — Ambulatory Visit: Payer: 59 | Admitting: Family Medicine

## 2023-04-02 ENCOUNTER — Encounter: Payer: Self-pay | Admitting: Family Medicine

## 2023-04-02 VITALS — BP 114/62 | HR 96 | Temp 97.9°F | Ht 70.5 in | Wt 227.6 lb

## 2023-04-02 DIAGNOSIS — R059 Cough, unspecified: Secondary | ICD-10-CM

## 2023-04-02 LAB — POC COVID19 BINAXNOW: SARS Coronavirus 2 Ag: NEGATIVE

## 2023-04-02 MED ORDER — CEFUROXIME AXETIL 500 MG PO TABS: 500.0000 mg | ORAL_TABLET | Freq: Two times a day (BID) | ORAL | 0 refills | Status: DC

## 2023-04-02 NOTE — Patient Instructions (Signed)
Be sure to set up medical follow up by September.

## 2023-04-02 NOTE — Progress Notes (Unsigned)
Established Patient Office Visit  Subjective   Patient ID: Gary Mora, male    DOB: 06-27-1966  Age: 57 y.o. MRN: [id number]  Chief Complaint  Patient presents with   Cough   Nasal Congestion   Generalized Body Aches    HPI  {History (Optional):23778} [place] is seen with chest congestion and nasal congestion over the past 4 or 5 days.  Clesson is seen with chest congestion and nasal congestion over the past 4 or 5 days.  He did a home COVID test which came back negative and this was also negative here today.  He coughed up little bit of blood-tinged mucus once.  Questionable low-grade fever intermittently.  He states he had pneumonia multiple times in the past and that was his main concern.  Quit smoking 5 years ago.  Possible intermittent mild wheezing.  He has had some generalized fatigue.  Has taken over-the-counter medications including NyQuil.  Cough occasionally productive.  He has lost about 35 pounds due to his efforts.  He is doing some intermittent fasting and scaling back overall calories and high glycemic foods  Past Medical History:  Diagnosis Date   Allergy    Chicken pox    Chronic kidney disease    kidney stone   Diabetes mellitus without complication (HCC)    Headache(784.0)    tension after using the computer   Hypertension    Sleep apnea    Had surgery to correct no cpapa used   Substance abuse [facility])    Past Surgical History:  Procedure Laterality Date   COLONOSCOPY     2018   MOUTH SURGERY     TONSILLECTOMY  2006   UVULOPALATOPHARYNGOPLASTY  2005    reports that he quit smoking about 5 years ago. His smoking use included cigarettes. He started smoking about 35 years ago. He has a 30 pack-year smoking history. He has never used smokeless tobacco. He reports that he does not drink alcohol and does not use drugs. family history includes Arthritis in his father and mother; Dementia in his father; Hypothyroidism in his sister. Allergies  Allergen  Reactions   Asa Buff (Mag [Buffered Aspirin]     hives    Review of Systems  Constitutional:  Negative for chills and fever.  Respiratory:  Positive for cough and sputum production. Negative for shortness of breath and wheezing.   Cardiovascular:  Negative for chest pain.      Objective:     BP 114/62 (BP Location: Left Arm, Patient Position: Sitting, Cuff Size: Normal)   Pulse 96   Temp 97.9 F (36.6 C) (Oral)   Ht 5' 10.5" (1.791 m)   Wt 227 lb 9.6 oz (103.2 kg)   SpO2 98%   BMI 32.20 kg/m  BP Readings from Last 3 Encounters:  04/02/23 114/62  08/29/22 120/65  07/15/22 102/60   Wt Readings from Last 3 Encounters:  04/02/23 227 lb 9.6 oz (103.2 kg)  07/15/22 252 lb 1.6 oz (114.4 kg)  07/01/22 250 lb (113.4 kg)      Physical Exam Vitals reviewed.  Constitutional:      General: He is not in acute distress.    Appearance: Normal appearance. He is not ill-appearing.  HENT:     Mouth/Throat:     Mouth: Mucous membranes are moist.     Pharynx: Oropharynx is clear.  Cardiovascular:     Rate and Rhythm: Normal rate and regular rhythm.  Pulmonary:     Effort: Pulmonary effort  is normal.     Breath sounds: Normal breath sounds. No wheezing or rales.  Neurological:     Mental Status: He is alert.      Results for orders placed or performed in visit on 04/02/23  POC COVID-19  Result Value Ref Range   SARS Coronavirus 2 Ag Negative Negative    {Labs (Optional):23779}  The 10-year ASCVD risk score (Arnett DK, et al., 2019) is: 11.3%    Assessment & Plan:   Problem List Items Addressed This Visit   None Visit Diagnoses     Cough, unspecified type    -  Primary   Relevant Orders   POC COVID-19 (Completed)     Patient presents with about 5-day history of productive cough.  Ex-smoker.  History of frequent pneumonia.  Nonfocal exam this time.  COVID test negative.  We discussed fact this may be all viral but he has had some productive cough for several days  and with his history decided to go and cover with Ceftin 500 mg twice daily for 7 days.  Try over-the-counter Mucinex 600 mg 1-2 every 12 hours.  Follow-up immediately for any fever or worsening symptoms.  Set up complete physical in the next few months.  He is congratulated regarding his weight loss.  Hopefully we can start to withdrawal some medications in September if his diabetes and blood pressure still looking good  No follow-ups on file.    Evelena Peat, MD

## 2023-04-03 ENCOUNTER — Encounter (INDEPENDENT_AMBULATORY_CARE_PROVIDER_SITE_OTHER): Payer: Self-pay

## 2023-04-28 ENCOUNTER — Other Ambulatory Visit: Payer: Self-pay | Admitting: Family Medicine

## 2023-05-12 ENCOUNTER — Ambulatory Visit (INDEPENDENT_AMBULATORY_CARE_PROVIDER_SITE_OTHER): Payer: 59 | Admitting: Family Medicine

## 2023-05-12 VITALS — BP 122/80 | HR 100 | Temp 97.7°F | Ht 70.08 in | Wt 225.7 lb

## 2023-05-12 DIAGNOSIS — Z122 Encounter for screening for malignant neoplasm of respiratory organs: Secondary | ICD-10-CM

## 2023-05-12 DIAGNOSIS — I10 Essential (primary) hypertension: Secondary | ICD-10-CM | POA: Diagnosis not present

## 2023-05-12 DIAGNOSIS — Z0000 Encounter for general adult medical examination without abnormal findings: Secondary | ICD-10-CM | POA: Diagnosis not present

## 2023-05-12 DIAGNOSIS — E1165 Type 2 diabetes mellitus with hyperglycemia: Secondary | ICD-10-CM

## 2023-05-12 DIAGNOSIS — E1140 Type 2 diabetes mellitus with diabetic neuropathy, unspecified: Secondary | ICD-10-CM | POA: Diagnosis not present

## 2023-05-12 DIAGNOSIS — E8881 Metabolic syndrome: Secondary | ICD-10-CM | POA: Diagnosis not present

## 2023-05-12 DIAGNOSIS — Z23 Encounter for immunization: Secondary | ICD-10-CM | POA: Diagnosis not present

## 2023-05-12 LAB — HEPATIC FUNCTION PANEL
ALT: 13 U/L (ref 0–53)
AST: 17 U/L (ref 0–37)
Albumin: 4.1 g/dL (ref 3.5–5.2)
Alkaline Phosphatase: 78 U/L (ref 39–117)
Bilirubin, Direct: 0.2 mg/dL (ref 0.0–0.3)
Total Bilirubin: 1.1 mg/dL (ref 0.2–1.2)
Total Protein: 7.3 g/dL (ref 6.0–8.3)

## 2023-05-12 LAB — CBC WITH DIFFERENTIAL/PLATELET
Basophils Absolute: 0.1 10*3/uL (ref 0.0–0.1)
Basophils Relative: 1 % (ref 0.0–3.0)
Eosinophils Absolute: 0.2 10*3/uL (ref 0.0–0.7)
Eosinophils Relative: 2.8 % (ref 0.0–5.0)
HCT: 56.1 % — ABNORMAL HIGH (ref 39.0–52.0)
Hemoglobin: 17.9 g/dL — ABNORMAL HIGH (ref 13.0–17.0)
Lymphocytes Relative: 24.4 % (ref 12.0–46.0)
Lymphs Abs: 1.7 10*3/uL (ref 0.7–4.0)
MCHC: 31.3 g/dL (ref 30.0–36.0)
MCV: 91.9 fl (ref 78.0–100.0)
Monocytes Absolute: 0.6 10*3/uL (ref 0.1–1.0)
Monocytes Relative: 8.8 % (ref 3.0–12.0)
Neutro Abs: 4.4 10*3/uL (ref 1.4–7.7)
Neutrophils Relative %: 63 % (ref 43.0–77.0)
Platelets: 289 10*3/uL (ref 150.0–400.0)
RBC: 6.22 Mil/uL — ABNORMAL HIGH (ref 4.22–5.81)
RDW: 14.1 % (ref 11.5–15.5)
WBC: 6.9 10*3/uL (ref 4.0–10.5)

## 2023-05-12 LAB — LIPID PANEL
Cholesterol: 126 mg/dL (ref 0–200)
HDL: 28.2 mg/dL — ABNORMAL LOW (ref >39.00)
LDL Cholesterol: 67 mg/dL (ref 0–99)
NonHDL: 97.84
Total CHOL/HDL Ratio: 4
Triglycerides: 155 mg/dL — ABNORMAL HIGH (ref 0.0–149.0)
VLDL: 31 mg/dL (ref 0.0–40.0)

## 2023-05-12 LAB — MICROALBUMIN / CREATININE URINE RATIO
Creatinine,U: 90.2 mg/dL
Microalb Creat Ratio: 1.6 mg/g (ref 0.0–30.0)
Microalb, Ur: 1.4 mg/dL (ref 0.0–1.9)

## 2023-05-12 LAB — HEMOGLOBIN A1C: Hgb A1c MFr Bld: 6.4 % (ref 4.6–6.5)

## 2023-05-12 LAB — BASIC METABOLIC PANEL
BUN: 15 mg/dL (ref 6–23)
CO2: 29 mEq/L (ref 19–32)
Calcium: 9.6 mg/dL (ref 8.4–10.5)
Chloride: 100 mEq/L (ref 96–112)
Creatinine, Ser: 0.96 mg/dL (ref 0.40–1.50)
GFR: 87.61 mL/min (ref >60.00)
Glucose, Bld: 93 mg/dL (ref 70–99)
Potassium: 4.8 mEq/L (ref 3.5–5.1)
Sodium: 138 mEq/L (ref 135–145)

## 2023-05-12 LAB — PSA: PSA: 0.64 ng/mL (ref 0.10–4.00)

## 2023-05-12 NOTE — Progress Notes (Signed)
Established Patient Office Visit  Subjective   Patient ID: Gary Mora, male    DOB: 08/25/1965  Age: 57 y.o. MRN: [id number]  Chief Complaint  Patient presents with   Annual Exam    HPI   Gary Mora is seen for physical exam.  He has done excellent job with weight loss since last year.  His weight at physical last year was 256 pounds to current weight of 225 pounds.  He hopes to lose about 25 more pounds.  He is due for follow-up labs soon. Quit smoking about 5 years ago with over 30-pack-year history.  He does have type 2 diabetes with recent improving control with his weight loss.  Health maintenance reviewed:  Health Maintenance  Topic Date Due   HIV Screening  Never done   Lung Cancer Screening  Never done   FOOT EXAM  02/21/2022   DTaP/Tdap/Td (3 - Td or Tdap) 08/24/2022   Diabetic kidney evaluation - eGFR measurement  12/19/2022   Diabetic kidney evaluation - Urine ACR  12/19/2022   HEMOGLOBIN A1C  01/13/2023   INFLUENZA VACCINE  03/20/2023   COVID-19 Vaccine (3 - 2023-24 season) 04/20/2023   OPHTHALMOLOGY EXAM  09/04/2023   Colonoscopy  03/16/2026   Hepatitis C Screening  Completed   Zoster Vaccines- Shingrix  Completed   HPV VACCINES  Aged Out   -He does need flu vaccine and tetanus.  He has had Shingrix previously.  Colonoscopy up-to-date  Social history-divorced.  He has 2 children ages 30 and 22.  Quit smoking 6 years ago.  30-pack-year history.  History of binge alcohol drinking but no alcohol in 9 years now.  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   Family history reviewed.  No significant changes.  Father died of complications of dementia.  Both parents had degenerative arthritis.  He has 1 brother and 3 sisters.  One of his sisters has hypothyroidism.  No family history of premature CAD or type 2 diabetes.  His mother still alive at age 72.  History of hypertension otherwise fairly healthy.  Past Medical History:  Diagnosis Date   Allergy     Chicken pox    Chronic kidney disease    kidney stone   Diabetes mellitus without complication (HCC)    Headache(784.0)    tension after using the computer   Hypertension    Sleep apnea    Had surgery to correct no cpapa used   Substance abuse [facility])    Past Surgical History:  Procedure Laterality Date   COLONOSCOPY     2018   MOUTH SURGERY     TONSILLECTOMY  2006   UVULOPALATOPHARYNGOPLASTY  2005    reports that he quit smoking about 5 years ago. His smoking use included cigarettes. He started smoking about 35 years ago. He has a 30 pack-year smoking history. He has never used smokeless tobacco. He reports that he does not drink alcohol and does not use drugs. family history includes Arthritis in his father and mother; Dementia in his father; Hypothyroidism in his sister. Allergies  Allergen Reactions   Asa Buff (Mag [Buffered Aspirin]     hives     Review of Systems  Constitutional:  Negative for chills, fever, malaise/fatigue and weight loss.  HENT:  Negative for hearing loss.   Eyes:  Negative for blurred vision and double vision.  Respiratory:  Negative for cough and shortness of breath.   Cardiovascular:  Negative for chest pain, palpitations  and leg swelling.  Gastrointestinal:  Negative for abdominal pain, blood in stool, constipation and diarrhea.  Genitourinary:  Negative for dysuria.  Skin:  Negative for rash.  Neurological:  Negative for dizziness, speech change, seizures, loss of consciousness and headaches.  Psychiatric/Behavioral:  Negative for depression.       Objective:     BP 122/80 (BP Location: Left Arm, Cuff Size: Normal)   Pulse 100   Temp 97.7 F (36.5 C) (Oral)   Ht 5' 10.08" (1.78 m)   Wt 225 lb 11.2 oz (102.4 kg)   SpO2 96%   BMI 32.31 kg/m  BP Readings from Last 3 Encounters:  05/12/23 122/80  04/02/23 114/62  08/29/22 120/65   Wt Readings from Last 3 Encounters:  05/12/23 225 lb 11.2 oz (102.4 kg)  04/02/23 227 lb 9.6 oz (103.2  kg)  07/15/22 252 lb 1.6 oz (114.4 kg)      Physical Exam Vitals reviewed.  Constitutional:      General: He is not in acute distress.    Appearance: He is well-developed. He is not ill-appearing.  HENT:     Head: Normocephalic and atraumatic.     Right Ear: External ear normal.     Left Ear: External ear normal.  Eyes:     Conjunctiva/sclera: Conjunctivae normal.     Pupils: Pupils are equal, round, and reactive to light.  Neck:     Thyroid: No thyromegaly.  Cardiovascular:     Rate and Rhythm: Normal rate and regular rhythm.     Heart sounds: Normal heart sounds. No murmur heard. Pulmonary:     Effort: No respiratory distress.     Breath sounds: No wheezing or rales.  Abdominal:     General: Bowel sounds are normal. There is no distension.     Palpations: Abdomen is soft. There is no mass.     Tenderness: There is no abdominal tenderness. There is no guarding or rebound.  Musculoskeletal:     Cervical back: Normal range of motion and neck supple.     Right lower leg: No edema.     Left lower leg: No edema.  Lymphadenopathy:     Cervical: No cervical adenopathy.  Skin:    Findings: No rash.  Neurological:     Mental Status: He is alert and oriented to person, place, and time.     Cranial Nerves: No cranial nerve deficit.      No results found for any visits on 05/12/23.    The 10-year ASCVD risk score (Arnett DK, et al., 2019) is: 12.6%    Assessment & Plan:   Problem List Items Addressed This Visit       Unprioritized   Poorly controlled type 2 diabetes mellitus with neuropathy (HCC)   Relevant Orders   Hemoglobin A1c   Microalbumin/Creatinine Ratio, Urine   Essential hypertension   Metabolic syndrome   Relevant Orders   Lipid panel   Other Visit Diagnoses     Physical exam    -  Primary   Relevant Orders   Basic metabolic panel   CBC with Differential/Platelet   Hepatic function panel   PSA   Screening for lung cancer       Relevant Orders    Ambulatory Referral for Lung Cancer Scre   Need for tetanus booster       Relevant Orders   Tdap vaccine greater than or equal to 7yo IM   Need for influenza vaccination  Relevant Orders   Flu vaccine trivalent PF, 6mos and older(Flulaval,Afluria,Fluarix,Fluzone)     57 year old male with chronic medical problems as above.  He is done excellent job with diet modification and weight loss and hopes to lose more.  We discussed the following health maintenance items  -Recommend flu vaccine and he consents -Recommend tetanus booster -Shingrix has been completed -Obtain follow-up labs as above including PSA -We did discuss low-dose CT lung cancer screening.  He does meet criteria and he would like to proceed with that.  Referral placed. -Continue yearly diabetic eye exam  Return in about 6 months (around 11/09/2023).    Evelena Peat, MD

## 2023-06-05 ENCOUNTER — Other Ambulatory Visit: Payer: Self-pay | Admitting: Family Medicine

## 2023-08-11 ENCOUNTER — Ambulatory Visit (INDEPENDENT_AMBULATORY_CARE_PROVIDER_SITE_OTHER): Payer: 59 | Admitting: Family Medicine

## 2023-08-11 ENCOUNTER — Encounter: Payer: Self-pay | Admitting: Family Medicine

## 2023-08-11 VITALS — BP 128/80 | HR 100 | Temp 97.5°F | Ht 70.0 in | Wt 240.2 lb

## 2023-08-11 DIAGNOSIS — I10 Essential (primary) hypertension: Secondary | ICD-10-CM | POA: Diagnosis not present

## 2023-08-11 DIAGNOSIS — E1140 Type 2 diabetes mellitus with diabetic neuropathy, unspecified: Secondary | ICD-10-CM

## 2023-08-11 DIAGNOSIS — D751 Secondary polycythemia: Secondary | ICD-10-CM | POA: Diagnosis not present

## 2023-08-11 DIAGNOSIS — E1165 Type 2 diabetes mellitus with hyperglycemia: Secondary | ICD-10-CM

## 2023-08-11 LAB — CBC WITH DIFFERENTIAL/PLATELET
Basophils Absolute: 0.1 10*3/uL (ref 0.0–0.1)
Basophils Relative: 0.9 % (ref 0.0–3.0)
Eosinophils Absolute: 0.3 10*3/uL (ref 0.0–0.7)
Eosinophils Relative: 4 % (ref 0.0–5.0)
HCT: 51.3 % (ref 39.0–52.0)
Hemoglobin: 17.2 g/dL — ABNORMAL HIGH (ref 13.0–17.0)
Lymphocytes Relative: 26.9 % (ref 12.0–46.0)
Lymphs Abs: 2 10*3/uL (ref 0.7–4.0)
MCHC: 33.5 g/dL (ref 30.0–36.0)
MCV: 91.6 fL (ref 78.0–100.0)
Monocytes Absolute: 0.6 10*3/uL (ref 0.1–1.0)
Monocytes Relative: 8.1 % (ref 3.0–12.0)
Neutro Abs: 4.4 10*3/uL (ref 1.4–7.7)
Neutrophils Relative %: 60.1 % (ref 43.0–77.0)
Platelets: 308 10*3/uL (ref 150.0–400.0)
RBC: 5.6 Mil/uL (ref 4.22–5.81)
RDW: 13.8 % (ref 11.5–15.5)
WBC: 7.3 10*3/uL (ref 4.0–10.5)

## 2023-08-11 NOTE — Patient Instructions (Signed)
Get back on sound eating plan    Be sure to set up 3 month follow up and we will need to repeat A1C then.

## 2023-08-11 NOTE — Progress Notes (Signed)
Established Patient Office Visit  Subjective   Patient ID: Gary Mora, male    DOB: 1965-12-27  Age: 57 y.o. MRN: [id number]  No chief complaint on file.   HPI   Gary Mora has chronic problems including hypertension, obesity, obstructive sleep apnea, type 2 diabetes, metabolic syndrome.  He quit smoking about 5 years ago.  He had recent physical.  He has done a tremendous job with weight loss with dietary change and weight was down to 225 pounds.  However, has been doing a lot of baking over the past couple of months and has gained back 15 pounds.  Not monitoring blood sugars regularly.  Recent A1c was down to 6.4%.  He remains on metformin and Jardiance.  On lab work did have some polycythemia with hemoglobin close to 18.  As above, quit smoking 5 years ago.  No hormone replacement therapy.  No known family history of polycythemia vera.  He did go and donate blood few weeks ago.  Past Medical History:  Diagnosis Date   Allergy    Chicken pox    Chronic kidney disease    kidney stone   Diabetes mellitus without complication (HCC)    Headache(784.0)    tension after using the computer   Hypertension    Sleep apnea    Had surgery to correct no cpapa used   Substance abuse [facility])    Past Surgical History:  Procedure Laterality Date   COLONOSCOPY     2018   MOUTH SURGERY     TONSILLECTOMY  2006   UVULOPALATOPHARYNGOPLASTY  2005    reports that he quit smoking about 5 years ago. His smoking use included cigarettes. He started smoking about 35 years ago. He has a 30 pack-year smoking history. He has never used smokeless tobacco. He reports that he does not drink alcohol and does not use drugs. family history includes Arthritis in his father and mother; Dementia in his father; Hypothyroidism in his sister. Allergies  Allergen Reactions   Asa Buff (Mag [Buffered Aspirin]     hives    Review of Systems  Constitutional:  Negative for malaise/fatigue.  Eyes:  Negative for blurred  vision.  Respiratory:  Negative for shortness of breath.   Cardiovascular:  Negative for chest pain.  Neurological:  Negative for dizziness, weakness and headaches.      Objective:     BP 128/80 (BP Location: Left Arm, Patient Position: Sitting, Cuff Size: Normal)   Pulse 100   Temp (!) 97.5 F (36.4 C) (Oral)   Ht 5\' 10"  (1.778 m)   Wt 240 lb 3.2 oz (109 kg)   SpO2 95%   BMI 34.47 kg/m  BP Readings from Last 3 Encounters:  08/11/23 128/80  05/12/23 122/80  04/02/23 114/62   Wt Readings from Last 3 Encounters:  08/11/23 240 lb 3.2 oz (109 kg)  05/12/23 225 lb 11.2 oz (102.4 kg)  04/02/23 227 lb 9.6 oz (103.2 kg)      Physical Exam Vitals reviewed.  Constitutional:      Appearance: He is well-developed.  Neck:     Thyroid: No thyromegaly.  Cardiovascular:     Rate and Rhythm: Normal rate and regular rhythm.  Pulmonary:     Effort: Pulmonary effort is normal. No respiratory distress.     Breath sounds: Normal breath sounds. No wheezing or rales.  Neurological:     Mental Status: He is alert and oriented to person, place, and time.  No results found for any visits on 08/11/23.  Last CBC Lab Results  Component Value Date   WBC 6.9 05/12/2023   HGB 17.9 (H) 05/12/2023   HCT 56.1 (H) 05/12/2023   MCV 91.9 05/12/2023   RDW 14.1 05/12/2023   PLT 289.0 05/12/2023   Last metabolic panel Lab Results  Component Value Date   GLUCOSE 93 05/12/2023   NA 138 05/12/2023   K 4.8 05/12/2023   CL 100 05/12/2023   CO2 29 05/12/2023   BUN 15 05/12/2023   CREATININE 0.96 05/12/2023   GFR 87.61 05/12/2023   CALCIUM 9.6 05/12/2023   PROT 7.3 05/12/2023   ALBUMIN 4.1 05/12/2023   BILITOT 1.1 05/12/2023   ALKPHOS 78 05/12/2023   AST 17 05/12/2023   ALT 13 05/12/2023   Last lipids Lab Results  Component Value Date   CHOL 126 05/12/2023   HDL 28.20 (L) 05/12/2023   LDLCALC 67 05/12/2023   LDLDIRECT 99.0 12/18/2021   TRIG 155.0 (H) 05/12/2023   CHOLHDL 4  05/12/2023   Last hemoglobin A1c Lab Results  Component Value Date   HGBA1C 6.4 05/12/2023      The ASCVD Risk score (Arnett DK, et al., 2019) failed to calculate for the following reasons:   The valid total cholesterol range is 130 to 320 mg/dL    Assessment & Plan:   #1 polycythemia.  Ex-smoker.  No hormonal therapy.  Recheck CBC today.  #2 hypertension stable and at goal on amlodipine and lisinopril.  Continue current regimen.  He is encouraged to try to lose some weight with ultimate goal around 200 or less  #3 type 2 diabetes.  Last A1c 6.4%.  He declines A1c today knowing this will likely be elevated from last visit secondary to poor compliance.  He prefers to give this 3 months of lifestyle modification and recheck A1c at follow-up in March.  Reviewed diet in some detail.  We did recommend he consider app to track glycemic index and glycemic load of various foods   Return in about 3 months (around 11/09/2023).    Evelena Peat, MD

## 2023-08-14 ENCOUNTER — Telehealth: Payer: Self-pay | Admitting: Family Medicine

## 2023-08-14 NOTE — Telephone Encounter (Signed)
Pt is returning mykal call concerning blood work result 

## 2023-08-17 ENCOUNTER — Other Ambulatory Visit: Payer: Self-pay | Admitting: Family Medicine

## 2023-08-18 ENCOUNTER — Telehealth: Payer: Self-pay

## 2023-08-18 NOTE — Telephone Encounter (Signed)
Copied from CRM [id number]. Topic: Clinical - Lab/Test Results >> Aug 15, 2023  3:41 PM Chantha C wrote: Reason for CRM: Pt is returning Gary Mora's call on lab results, pt is aware they are not in the office today and can wait for a call on Monday. Pls c/b at [phone].

## 2023-08-18 NOTE — Telephone Encounter (Signed)
Please see previous encounter

## 2023-08-18 NOTE — Telephone Encounter (Signed)
Please see result note 

## 2023-08-18 NOTE — Telephone Encounter (Signed)
Pt call and stated he is returning your call.

## 2023-09-16 ENCOUNTER — Other Ambulatory Visit: Payer: Self-pay | Admitting: Family Medicine

## 2023-10-25 ENCOUNTER — Other Ambulatory Visit: Payer: Self-pay | Admitting: Family Medicine

## 2023-11-10 ENCOUNTER — Encounter: Payer: Self-pay | Admitting: Family Medicine

## 2023-11-10 ENCOUNTER — Ambulatory Visit: Payer: 59 | Admitting: Family Medicine

## 2023-11-10 VITALS — BP 122/80 | HR 88 | Temp 98.4°F | Wt 239.7 lb

## 2023-11-10 DIAGNOSIS — I10 Essential (primary) hypertension: Secondary | ICD-10-CM

## 2023-11-10 DIAGNOSIS — E1140 Type 2 diabetes mellitus with diabetic neuropathy, unspecified: Secondary | ICD-10-CM

## 2023-11-10 DIAGNOSIS — E8881 Metabolic syndrome: Secondary | ICD-10-CM

## 2023-11-10 DIAGNOSIS — S90421A Blister (nonthermal), right great toe, initial encounter: Secondary | ICD-10-CM | POA: Diagnosis not present

## 2023-11-10 DIAGNOSIS — E1165 Type 2 diabetes mellitus with hyperglycemia: Secondary | ICD-10-CM | POA: Diagnosis not present

## 2023-11-10 DIAGNOSIS — Z7984 Long term (current) use of oral hypoglycemic drugs: Secondary | ICD-10-CM

## 2023-11-10 LAB — POCT GLYCOSYLATED HEMOGLOBIN (HGB A1C): Hemoglobin A1C: 6.9 % — AB (ref 4.0–5.6)

## 2023-11-10 MED ORDER — SILDENAFIL CITRATE 100 MG PO TABS: 100.0000 mg | ORAL_TABLET | Freq: Every day | ORAL | 11 refills | Status: AC | PRN

## 2023-11-10 NOTE — Progress Notes (Signed)
 Established Patient Office Visit  Subjective   Patient ID: Gary Mora, male    DOB: December 06, 1965  Age: 58 y.o. MRN: [id number]  No chief complaint on file.   HPI   Gary Mora is seen today for medical follow-up.  He states one of his sons got married couple weeks ago and he had some poor dietary compliance.  Plans to get back on track soon.  His weight is essentially unchanged from last visit.  Last A1c was improved to 6.4%.  His diabetes is currently treated with metformin and Jardiance.  Denies any polyuria or polydipsia.  He has hypertension treated with amlodipine 5 mg daily and lisinopril 10 mg daily.  He has hyperlipidemia treated with atorvastatin 10 mg daily.  Did run out of medications couple weeks ago when he was at wedding but has been back on all of his meds for the past week.  He unfortunately did develop right great toe blister from wearing tuxedo shoes couple weeks ago at the wedding.  No signs of infection thus far.  No purulent drainage.  Plans to set up follow-up with podiatrist this week.  History of chronic polycythemia.  Quit smoking 7 to 8 years ago.  Last hematocrit 56.  He has been donating blood every couple months but has not for several months now.  Past Medical History:  Diagnosis Date   Allergy    Chicken pox    Chronic kidney disease    kidney stone   Diabetes mellitus without complication (HCC)    Headache(784.0)    tension after using the computer   Hypertension    Sleep apnea    Had surgery to correct no cpapa used   Substance abuse [facility])    Past Surgical History:  Procedure Laterality Date   COLONOSCOPY     2018   MOUTH SURGERY     TONSILLECTOMY  2006   UVULOPALATOPHARYNGOPLASTY  2005    reports that he quit smoking about 5 years ago. His smoking use included cigarettes. He started smoking about 36 years ago. He has a 30 pack-year smoking history. He has never used smokeless tobacco. He reports that he does not drink alcohol and does not use  drugs. family history includes Arthritis in his father and mother; Dementia in his father; Hypothyroidism in his sister. Allergies  Allergen Reactions   Asa Buff (Mag [Buffered Aspirin]     hives    Review of Systems  Constitutional:  Negative for chills, fever and malaise/fatigue.  Eyes:  Negative for blurred vision.  Respiratory:  Negative for shortness of breath.   Cardiovascular:  Negative for chest pain.  Neurological:  Negative for dizziness, weakness and headaches.      Objective:     BP 122/80 (BP Location: Left Arm, Cuff Size: Normal)   Pulse 88   Temp 98.4 F (36.9 C) (Oral)   Wt 239 lb 11.2 oz (108.7 kg)   SpO2 96%   BMI 34.39 kg/m  BP Readings from Last 3 Encounters:  11/10/23 122/80  08/11/23 128/80  05/12/23 122/80   Wt Readings from Last 3 Encounters:  11/10/23 239 lb 11.2 oz (108.7 kg)  08/11/23 240 lb 3.2 oz (109 kg)  05/12/23 225 lb 11.2 oz (102.4 kg)      Physical Exam Vitals reviewed.  Constitutional:      Appearance: He is well-developed.  HENT:     Right Ear: External ear normal.     Left Ear: External ear normal.  Eyes:     Pupils: Pupils are equal, round, and reactive to light.  Neck:     Thyroid: No thyromegaly.  Cardiovascular:     Rate and Rhythm: Normal rate and regular rhythm.  Pulmonary:     Effort: Pulmonary effort is normal. No respiratory distress.     Breath sounds: Normal breath sounds. No wheezing or rales.  Musculoskeletal:     Cervical back: Neck supple.  Skin:    Comments: Right great toe medially reveals small blood blister and callused area with little bit of dried blood on the surface.  No fluctuance.  No surrounding erythema or any cellulitis changes.  No warmth.  No necrosis.  Neurological:     Mental Status: He is alert and oriented to person, place, and time.      Results for orders placed or performed in visit on 11/10/23  POC HgB A1c  Result Value Ref Range   Hemoglobin A1C 6.9 (A) 4.0 - 5.6 %    HbA1c POC (<> result, manual entry)     HbA1c, POC (prediabetic range)     HbA1c, POC (controlled diabetic range)      Last CBC Lab Results  Component Value Date   WBC 7.3 08/11/2023   HGB 17.2 (H) 08/11/2023   HCT 51.3 08/11/2023   MCV 91.6 08/11/2023   RDW 13.8 08/11/2023   PLT 308.0 08/11/2023   Last metabolic panel Lab Results  Component Value Date   GLUCOSE 93 05/12/2023   NA 138 05/12/2023   K 4.8 05/12/2023   CL 100 05/12/2023   CO2 29 05/12/2023   BUN 15 05/12/2023   CREATININE 0.96 05/12/2023   GFR 87.61 05/12/2023   CALCIUM 9.6 05/12/2023   PROT 7.3 05/12/2023   ALBUMIN 4.1 05/12/2023   BILITOT 1.1 05/12/2023   ALKPHOS 78 05/12/2023   AST 17 05/12/2023   ALT 13 05/12/2023   Last lipids Lab Results  Component Value Date   CHOL 126 05/12/2023   HDL 28.20 (L) 05/12/2023   LDLCALC 67 05/12/2023   LDLDIRECT 99.0 12/18/2021   TRIG 155.0 (H) 05/12/2023   CHOLHDL 4 05/12/2023   Last hemoglobin A1c Lab Results  Component Value Date   HGBA1C 6.9 (A) 11/10/2023      The ASCVD Risk score (Arnett DK, et al., 2019) failed to calculate for the following reasons:   The valid total cholesterol range is 130 to 320 mg/dL    Assessment & Plan:   #1 type 2 diabetes relatively stable with A1c 6.9%.  Tighten up diet and plan on 57-month follow-up.  He is encouraged to lose some additional weight.  Continue yearly diabetic eye exam.  Recheck urine microalbumin at follow-up  #2 hypertension.  Initial reading up today but improved significantly after rest.  Continue current regimen of amlodipine and lisinopril.  #3 hyperlipidemia treated with atorvastatin 10 mg daily.  Recent LDL cholesterol 67.  Continue Lipitor 10 mg daily.  Continue low saturated fat diet  #4 small blister right great toe medially.  No signs of secondary infection.  Strongly advocate setting up podiatry referral and he plans to do so this week.  Reviewed signs and symptoms of infection to watch for.   Clean daily with soap and water.   Return in about 6 months (around 05/12/2024).    Evelena Peat, MD

## 2023-11-20 ENCOUNTER — Ambulatory Visit: Admitting: Podiatry

## 2023-11-20 ENCOUNTER — Encounter: Payer: Self-pay | Admitting: Podiatry

## 2023-11-20 DIAGNOSIS — E08621 Diabetes mellitus due to underlying condition with foot ulcer: Secondary | ICD-10-CM

## 2023-11-20 DIAGNOSIS — L97512 Non-pressure chronic ulcer of other part of right foot with fat layer exposed: Secondary | ICD-10-CM

## 2023-11-20 NOTE — Progress Notes (Signed)
  Subjective:  Patient ID: Gary Mora, male    DOB: 01/06/1966,   MRN: [id number]  Chief Complaint  Patient presents with   Foot Ulcer    Pt presents for ulcer of right great toe that started bleeding a few days ago and callouse on the great toe on the left great toe.    58 y.o. male presents for return of wound on right great toe as above. Relates he was at his sons wedding two weeks ago and relates the area started getting dark. He has been keeping it clean. Also relates callus on his left second toe and possible ganglion.  Patient is diabetic and last A1c was 6.8 . Denies any other pedal complaints. Denies n/v/f/c.   PCP: Evelena Peat MD  Last seen. 07/15/22  Past Medical History:  Diagnosis Date   Allergy    Chicken pox    Chronic kidney disease    kidney stone   Diabetes mellitus without complication (HCC)    Headache(784.0)    tension after using the computer   Hypertension    Sleep apnea    Had surgery to correct no cpapa used   Substance abuse (HCC)     Objective:  Physical Exam: Vascular: DP/PT pulses 2/4 bilateral. CFT <3 seconds. Normal hair growth on digits. No edema.  Skin. No lacerations or abrasions bilateral feet. Hyperkeratotic lesion noted medial plantar right hallux. Ulceration noted with granular base. No erythema edema or purulence noted. Measurments below. Distal left second digit hyperkeratosis noted. 1 cm soft tissue mas noted to dorsum of left foot. Fluctuant and non mobile and non tender.  Musculoskeletal: MMT 5/5 bilateral lower extremities in DF, PF, Inversion and Eversion. Deceased ROM in DF of ankle joint.  Neurological: Sensation intact to light touch.   Assessment:   1. Diabetic ulcer of toe of right foot associated with diabetes mellitus due to underlying condition, with fat layer exposed (HCC)       Plan:  Patient was evaluated and treated and all questions answered. Ulcer right hallux with fat layer exposed  -Debridement as  below. -Dressed with betadine, DSD. -Off-loading with surgical shoe. Dispensed as he no longer has the older one.  -No abx indicated.  -Discussed glucose control and proper protein-rich diet.  -Discussed if any worsening redness, pain, fever or chills to call or may need to report to the emergency room. Patient expressed understanding.   Procedure: Excisional Debridement of Wound Rationale: Removal of non-viable soft tissue from the wound to promote healing.  Anesthesia: none Pre-Debridement Wound Measurements: Ovelying callus  Post-Debridement Wound Measurements: 0.5 cm x 0.2 cm x 0.2 cm  Type of Debridement: Sharp Excisional Tissue Removed: Non-viable soft tissue Depth of Debridement: subcutaneous tissue. Technique: Sharp excisional debridement to bleeding, viable wound base.  Dressing: Dry, sterile, compression dressing. Disposition: Patient tolerated procedure well. Patient to return in 2 week for follow-up.  Return in about 2 weeks (around 12/04/2023) for wound check.    Louann Sjogren, DPM

## 2023-12-04 ENCOUNTER — Ambulatory Visit: Admitting: Podiatry

## 2023-12-04 DIAGNOSIS — L97512 Non-pressure chronic ulcer of other part of right foot with fat layer exposed: Secondary | ICD-10-CM | POA: Diagnosis not present

## 2023-12-04 DIAGNOSIS — E08621 Diabetes mellitus due to underlying condition with foot ulcer: Secondary | ICD-10-CM | POA: Diagnosis not present

## 2023-12-04 NOTE — Progress Notes (Signed)
  Subjective:  Patient ID: Gary Mora, male    DOB: Mar 15, 1966,   MRN: [id number]  No chief complaint on file.   58 y.o. male presents for return of wound on right great toe . Relates dressing as instructed and doing well.  Patient is diabetic and last A1c was 6.8 . Denies any other pedal complaints. Denies n/v/f/c.   PCP: Glean Lamy MD  Last seen. 07/15/22  Past Medical History:  Diagnosis Date   Allergy    Chicken pox    Chronic kidney disease    kidney stone   Diabetes mellitus without complication (HCC)    Headache(784.0)    tension after using the computer   Hypertension    Sleep apnea    Had surgery to correct no cpapa used   Substance abuse (HCC)     Objective:  Physical Exam: Vascular: DP/PT pulses 2/4 bilateral. CFT <3 seconds. Normal hair growth on digits. No edema.  Skin. No lacerations or abrasions bilateral feet. Hyperkeratotic lesion noted medial plantar right hallux. Ulceration noted with granular base. No erythema edema or purulence noted. Measurments below. Distal left second digit hyperkeratosis noted. 1 cm soft tissue mas noted to dorsum of left foot. Fluctuant and non mobile and non tender.  Musculoskeletal: MMT 5/5 bilateral lower extremities in DF, PF, Inversion and Eversion. Deceased ROM in DF of ankle joint.  Neurological: Sensation intact to light touch.   Assessment:   1. Diabetic ulcer of toe of right foot associated with diabetes mellitus due to underlying condition, with fat layer exposed (HCC)        Plan:  Patient was evaluated and treated and all questions answered. Ulcer right hallux with fat layer exposed  -Debridement as below. -Dressed with betadine, DSD. -Off-loading with surgical shoe. Dispensed as he no longer has the older one.  -No abx indicated.  -Discussed glucose control and proper protein-rich diet.  -Discussed if any worsening redness, pain, fever or chills to call or may need to report to the emergency room.  Patient expressed understanding.   Procedure: Excisional Debridement of Wound Rationale: Removal of non-viable soft tissue from the wound to promote healing.  Anesthesia: none Pre-Debridement Wound Measurements: Ovelying callus  Post-Debridement Wound Measurements: 0.2 cm x 0.2 cm x 0.2 cm  Type of Debridement: Sharp Excisional Tissue Removed: Non-viable soft tissue Depth of Debridement: subcutaneous tissue. Technique: Sharp excisional debridement to bleeding, viable wound base.  Dressing: Dry, sterile, compression dressing. Disposition: Patient tolerated procedure well. Patient to return in 2 week for follow-up.  Return in about 2 weeks (around 12/18/2023) for wound check.    Jennefer Moats, DPM

## 2023-12-18 ENCOUNTER — Ambulatory Visit: Admitting: Podiatry

## 2023-12-18 ENCOUNTER — Encounter: Payer: Self-pay | Admitting: Podiatry

## 2023-12-18 DIAGNOSIS — E1142 Type 2 diabetes mellitus with diabetic polyneuropathy: Secondary | ICD-10-CM

## 2023-12-18 DIAGNOSIS — L84 Corns and callosities: Secondary | ICD-10-CM

## 2023-12-18 NOTE — Progress Notes (Signed)
  Subjective:  Patient ID: Gary Mora, male    DOB: 06/15/1966,   MRN: [id number]  No chief complaint on file.   58 y.o. male presents for return of wound on right great toe . Relates dressing as instructed and doing well.  Even went to the beach and didn't have much trouble. Patient is diabetic and last A1c was 6.8 . Denies any other pedal complaints. Denies n/v/f/c.   PCP: Glean Lamy MD  Last seen. 07/15/22  Past Medical History:  Diagnosis Date   Allergy    Chicken pox    Chronic kidney disease    kidney stone   Diabetes mellitus without complication (HCC)    Headache(784.0)    tension after using the computer   Hypertension    Sleep apnea    Had surgery to correct no cpapa used   Substance abuse (HCC)     Objective:  Physical Exam: Vascular: DP/PT pulses 2/4 bilateral. CFT <3 seconds. Normal hair growth on digits. No edema.  Skin. No lacerations or abrasions bilateral feet. Hyperkeratotic lesion noted medial plantar right hallux. Ulceration healed with ovelrying hyperkeratosis. No erythema edema or purulence noted.  Musculoskeletal: MMT 5/5 bilateral lower extremities in DF, PF, Inversion and Eversion. Deceased ROM in DF of ankle joint.  Neurological: Sensation intact to light touch.   Assessment:   1. Pre-ulcerative calluses   2. DM type 2 with diabetic peripheral neuropathy (HCC)         Plan:  Patient was evaluated and treated and all questions answered. Ulcer right hallux healed -Debridement of hyperkeratosis as courtesy with underlying wound healed.  -Dressed with bandaid -Off-loading with surgical shoe. For two more weeks than may get back to normal.  -No abx indicated.  -Discussed glucose control and proper protein-rich diet.  -Discussed if any worsening redness, pain, fever or chills to call or may need to report to the emergency room. Patient expressed understanding.   Return in 4 weeks for recheck   Return in about 4 weeks (around 01/15/2024)  for wound check.    Jennefer Moats, DPM

## 2024-01-15 ENCOUNTER — Ambulatory Visit: Admitting: Podiatry

## 2024-01-15 ENCOUNTER — Encounter: Payer: Self-pay | Admitting: Podiatry

## 2024-01-15 DIAGNOSIS — L97512 Non-pressure chronic ulcer of other part of right foot with fat layer exposed: Secondary | ICD-10-CM

## 2024-01-15 DIAGNOSIS — E08621 Diabetes mellitus due to underlying condition with foot ulcer: Secondary | ICD-10-CM

## 2024-01-15 NOTE — Progress Notes (Signed)
  Subjective:  Patient ID: Gary Mora, male    DOB: 1966/04/24,   MRN: [id number]  Chief Complaint  Patient presents with   Diabetic Ulcer    "It's doing rough.  It started bleeding again.  I been on it a lot.  I went to [place] and did a lot of walking."    58 y.o. male presents for return of wound on right great toe . Relates doing well but was in [place] and did a lot of walking a started bleeding and reopened. Patient is diabetic and last A1c was 6.8 . Denies any other pedal complaints. Denies n/v/f/c.   PCP: Glean Lamy MD  Last seen. 07/15/22  Past Medical History:  Diagnosis Date   Allergy    Chicken pox    Chronic kidney disease    kidney stone   Diabetes mellitus without complication (HCC)    Headache(784.0)    tension after using the computer   Hypertension    Sleep apnea    Had surgery to correct no cpapa used   Substance abuse (HCC)     Objective:  Physical Exam: Vascular: DP/PT pulses 2/4 bilateral. CFT <3 seconds. Normal hair growth on digits. No edema.  Skin. No lacerations or abrasions bilateral feet. Hyperkeratotic lesion noted medial plantar right hallux. Ulceration reopened. No erythema edema or purulence noted. Measurements below. . No erythema edema or purulence noted.  Musculoskeletal: MMT 5/5 bilateral lower extremities in DF, PF, Inversion and Eversion. Deceased ROM in DF of ankle joint.  Neurological: Sensation intact to light touch.   Assessment:   1. Diabetic ulcer of toe of right foot associated with diabetes mellitus due to underlying condition, with fat layer exposed (HCC)          Plan:  Patient was evaluated and treated and all questions answered. Ulcer plantar right hallux wound with fat layer exposed  -Debridement as below. -Dressed with betadine, DSD. -Off-loading with surgical shoe. -No abx indicated.  -Discussed glucose control and proper protein-rich diet.  -Discussed if any worsening redness, pain, fever or chills  to call or may need to report to the emergency room. Patient expressed understanding.   Procedure: Excisional Debridement of Wound Rationale: Removal of non-viable soft tissue from the wound to promote healing.  Anesthesia: none Pre-Debridement Wound Measurements: Overlying calus  Post-Debridement Wound Measurements: 0.5 cm x 0.3 cm x 0.2 cm  Type of Debridement: Sharp Excisional Tissue Removed: Non-viable soft tissue Depth of Debridement: subcutaneous tissue. Technique: Sharp excisional debridement to bleeding, viable wound base.  Dressing: Dry, sterile, compression dressing. Disposition: Patient tolerated procedure well. Patient to return in 2 week for follow-up.  Return in about 2 weeks (around 01/29/2024) for wound check.   Return in about 2 weeks (around 01/29/2024) for wound check.    Jennefer Moats, DPM

## 2024-01-29 ENCOUNTER — Ambulatory Visit: Admitting: Podiatry

## 2024-01-29 ENCOUNTER — Encounter: Payer: Self-pay | Admitting: Podiatry

## 2024-01-29 DIAGNOSIS — L97512 Non-pressure chronic ulcer of other part of right foot with fat layer exposed: Secondary | ICD-10-CM

## 2024-01-29 DIAGNOSIS — E08621 Diabetes mellitus due to underlying condition with foot ulcer: Secondary | ICD-10-CM | POA: Diagnosis not present

## 2024-01-29 NOTE — Progress Notes (Signed)
  Subjective:  Patient ID: Gary Mora, male    DOB: 03-May-1966,   MRN: [id number]  No chief complaint on file.   58 y.o. male presents for return of wound on right great toe . Relates doing well  and dressing as instructed.Patient is diabetic and last A1c was 6.8 . Denies any other pedal complaints. Denies n/v/f/c.   PCP: Glean Lamy MD    Past Medical History:  Diagnosis Date   Allergy    Chicken pox    Chronic kidney disease    kidney stone   Diabetes mellitus without complication (HCC)    Headache(784.0)    tension after using the computer   Hypertension    Sleep apnea    Had surgery to correct no cpapa used   Substance abuse (HCC)     Objective:  Physical Exam: Vascular: DP/PT pulses 2/4 bilateral. CFT <3 seconds. Normal hair growth on digits. No edema.  Skin. No lacerations or abrasions bilateral feet. Hyperkeratotic lesion noted medial plantar right hallux. Ulceration reopened. No erythema edema or purulence noted. Measurements below. . No erythema edema or purulence noted.  Musculoskeletal: MMT 5/5 bilateral lower extremities in DF, PF, Inversion and Eversion. Deceased ROM in DF of ankle joint.  Neurological: Sensation intact to light touch.      Assessment:   1. Diabetic ulcer of toe of right foot associated with diabetes mellitus due to underlying condition, with fat layer exposed (HCC)          Plan:  Patient was evaluated and treated and all questions answered. Ulcer plantar right hallux wound with fat layer exposed  -Debridement as below. -Dressed with betadine, DSD. -Off-loading with surgical shoe. -No abx indicated.  -Discussed glucose control and proper protein-rich diet.  -Discussed if any worsening redness, pain, fever or chills to call or may need to report to the emergency room. Patient expressed understanding.   Procedure: Excisional Debridement of Wound Rationale: Removal of non-viable soft tissue from the wound to promote healing.   Anesthesia: none Pre-Debridement Wound Measurements: Overlying calus  Post-Debridement Wound Measurements: 0.2 cm x 0.1 cm x 0.2 cm  Type of Debridement: Sharp Excisional Tissue Removed: Non-viable soft tissue Depth of Debridement: subcutaneous tissue. Technique: Sharp excisional debridement to bleeding, viable wound base.  Dressing: Dry, sterile, compression dressing. Disposition: Patient tolerated procedure well. Patient to return in 2 week for follow-up.  No follow-ups on file.   No follow-ups on file.    Jennefer Moats, DPM

## 2024-02-19 ENCOUNTER — Ambulatory Visit: Admitting: Podiatry

## 2024-02-19 ENCOUNTER — Encounter: Payer: Self-pay | Admitting: Podiatry

## 2024-02-19 DIAGNOSIS — L97512 Non-pressure chronic ulcer of other part of right foot with fat layer exposed: Secondary | ICD-10-CM

## 2024-02-19 DIAGNOSIS — E08621 Diabetes mellitus due to underlying condition with foot ulcer: Secondary | ICD-10-CM | POA: Diagnosis not present

## 2024-02-19 NOTE — Progress Notes (Signed)
  Subjective:  Patient ID: Gary Mora, male    DOB: 1965-12-03,   MRN: [id number]  Chief Complaint  Patient presents with   Diabetic Ulcer    It's okay, it has it's good days and bad days.  It still drains a little bit.    58 y.o. male presents for return of wound on right great toe . Relates doing well  and dressing as instructed.He does relate not wearing his boot as often as he should. Patient is diabetic and last A1c was 6.8 . Denies any other pedal complaints. Denies n/v/f/c.   PCP: Wolm Scarlet MD    Past Medical History:  Diagnosis Date   Allergy    Chicken pox    Chronic kidney disease    kidney stone   Diabetes mellitus without complication (HCC)    Headache(784.0)    tension after using the computer   Hypertension    Sleep apnea    Had surgery to correct no cpapa used   Substance abuse (HCC)     Objective:  Physical Exam: Vascular: DP/PT pulses 2/4 bilateral. CFT <3 seconds. Normal hair growth on digits. No edema.  Skin. No lacerations or abrasions bilateral feet. Hyperkeratotic lesion noted medial plantar right hallux. Ulceration reopened. No erythema edema or purulence noted. Measurements below. . No erythema edema or purulence noted.  Musculoskeletal: MMT 5/5 bilateral lower extremities in DF, PF, Inversion and Eversion. Deceased ROM in DF of ankle joint.  Neurological: Sensation intact to light touch.      Assessment:   1. Diabetic ulcer of toe of right foot associated with diabetes mellitus due to underlying condition, with fat layer exposed (HCC)           Plan:  Patient was evaluated and treated and all questions answered. Ulcer plantar right hallux wound with fat layer exposed  -Debridement as below. -Dressed with betadine, DSD. -Off-loading with surgical shoe. Will commit to wearing boot more often and hopefully will be healed in next two weeks.  -No abx indicated.  -Discussed glucose control and proper protein-rich diet.  -Discussed  if any worsening redness, pain, fever or chills to call or may need to report to the emergency room. Patient expressed understanding.   Procedure: Excisional Debridement of Wound Rationale: Removal of non-viable soft tissue from the wound to promote healing.  Anesthesia: none Pre-Debridement Wound Measurements: Overlying calus  Post-Debridement Wound Measurements: 0.2 cm x 0.1 cm x 0.2 cm  Type of Debridement: Sharp Excisional Tissue Removed: Non-viable soft tissue Depth of Debridement: subcutaneous tissue. Technique: Sharp excisional debridement to bleeding, viable wound base.  Dressing: Dry, sterile, compression dressing. Disposition: Patient tolerated procedure well. Patient to return in 2 week for follow-up.  Return in about 2 weeks (around 03/04/2024) for wound check.   Return in about 2 weeks (around 03/04/2024) for wound check.    Asberry Failing, DPM

## 2024-03-05 ENCOUNTER — Ambulatory Visit: Admitting: Podiatry

## 2024-03-05 ENCOUNTER — Encounter: Payer: Self-pay | Admitting: Podiatry

## 2024-03-05 VITALS — BP 130/95 | HR 108

## 2024-03-05 DIAGNOSIS — E08621 Diabetes mellitus due to underlying condition with foot ulcer: Secondary | ICD-10-CM

## 2024-03-05 DIAGNOSIS — L97512 Non-pressure chronic ulcer of other part of right foot with fat layer exposed: Secondary | ICD-10-CM

## 2024-03-05 NOTE — Progress Notes (Signed)
  Subjective:  Patient ID: Gary Mora, male    DOB: 01-05-1966,   MRN: [id number]  Chief Complaint  Patient presents with   Diabetic Ulcer    It's doing okay.  Looks about the same to me.  Saw Dr. Micheal - 11/10/2023; A1c - 6.4    58 y.o. male presents for return of wound on right great toe . Relates doing well  and dressing as instructed.He has worn the boot more but relates being on his feet a lot and will be going on a trip soon Patient is diabetic and last A1c was 6.8 . Denies any other pedal complaints. Denies n/v/f/c.   PCP: Wolm Micheal MD    Past Medical History:  Diagnosis Date   Allergy    Chicken pox    Chronic kidney disease    kidney stone   Diabetes mellitus without complication (HCC)    Headache(784.0)    tension after using the computer   Hypertension    Sleep apnea    Had surgery to correct no cpapa used   Substance abuse (HCC)     Objective:  Physical Exam: Vascular: DP/PT pulses 2/4 bilateral. CFT <3 seconds. Normal hair growth on digits. No edema.  Skin. No lacerations or abrasions bilateral feet. Hyperkeratotic lesion noted medial plantar right hallux. Ulceration reopened. No erythema edema or purulence noted. Measurements below. . No erythema edema or purulence noted.  Musculoskeletal: MMT 5/5 bilateral lower extremities in DF, PF, Inversion and Eversion. Deceased ROM in DF of ankle joint.  Neurological: Sensation intact to light touch.      Assessment:   1. 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 Ulcer plantar right hallux wound with fat layer exposed  -Debridement as below. -Dressed with betadine, DSD. -Off-loading with surgical shoe. Will commit to wearing boot more often did add some offloading padding to boot to aid with pressure in this area.  -No abx  indicated.  -Discussed glucose control and proper protein-rich diet.  -Discussed if any worsening redness, pain, fever or chills to call or may need to report to the emergency room. Patient expressed understanding.   Procedure: Excisional Debridement of Wound Rationale: Removal of non-viable soft tissue from the wound to promote healing.  Anesthesia: none Pre-Debridement Wound Measurements: Overlying calus  Post-Debridement Wound Measurements: 0.2 cm x 0.1 cm x 0.2 cm  Type of Debridement: Sharp Excisional Tissue Removed: Non-viable soft tissue Depth of Debridement: subcutaneous tissue. Technique: Sharp excisional debridement to bleeding, viable wound base.  Dressing: Dry, sterile, compression dressing. Disposition: Patient tolerated procedure well. Patient to return in 2 week for follow-up.  Return in about 3 weeks (around 03/26/2024) for wound check.   Return in about 3 weeks (around 03/26/2024) for wound check.    Asberry Failing, DPM

## 2024-03-26 ENCOUNTER — Ambulatory Visit: Admitting: Podiatry

## 2024-03-26 ENCOUNTER — Encounter: Payer: Self-pay | Admitting: Podiatry

## 2024-03-26 VITALS — BP 134/92 | HR 101

## 2024-03-26 DIAGNOSIS — L97512 Non-pressure chronic ulcer of other part of right foot with fat layer exposed: Secondary | ICD-10-CM

## 2024-03-26 DIAGNOSIS — E08621 Diabetes mellitus due to underlying condition with foot ulcer: Secondary | ICD-10-CM | POA: Diagnosis not present

## 2024-03-26 NOTE — Progress Notes (Signed)
  Subjective:  Patient ID: Gary Mora, male    DOB: 15-Feb-1966,   MRN: [id number]  Chief Complaint  Patient presents with   Diabetic Ulcer    58 y.o. male presents for return of wound on right great toe . Relates doing well  and dressing as instructed.He was in [place] for a funeral and relates doing a lot of walking so callus has built up more.  Patient is diabetic and last A1c was 6.8 . Denies any other pedal complaints. Denies n/v/f/c.   PCP: Wolm Scarlet MD    Past Medical History:  Diagnosis Date   Allergy    Chicken pox    Chronic kidney disease    kidney stone   Diabetes mellitus without complication (HCC)    Headache(784.0)    tension after using the computer   Hypertension    Sleep apnea    Had surgery to correct no cpapa used   Substance abuse (HCC)     Objective:  Physical Exam: Vascular: DP/PT pulses 2/4 bilateral. CFT <3 seconds. Normal hair growth on digits. No edema.  Skin. No lacerations or abrasions bilateral feet. Hyperkeratotic lesion noted medial plantar right hallux. Ulceration reopened. No erythema edema or purulence noted. Measurements below. . No erythema edema or purulence noted.  Musculoskeletal: MMT 5/5 bilateral lower extremities in DF, PF, Inversion and Eversion. Deceased ROM in DF of ankle joint.  Neurological: Sensation intact to light touch.      Assessment:   1. 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 Ulcer plantar right hallux wound with fat layer exposed  -Debridement as below. -Dressed with betadine, DSD. -Off-loading with surgical shoe. Will commit to wearing boot more often did add some offloading padding to boot to aid with pressure in this area.  -No abx indicated.  -Discussed glucose control and proper protein-rich diet.  -Discussed if any worsening  redness, pain, fever or chills to call or may need to report to the emergency room. Patient expressed understanding.   Procedure: Excisional Debridement of Wound Rationale: Removal of non-viable soft tissue from the wound to promote healing.  Anesthesia: none Pre-Debridement Wound Measurements: Overlying calus  Post-Debridement Wound Measurements: 0.2 cm x 0.5 cm x 0.2 cm  Type of Debridement: Sharp Excisional Tissue Removed: Non-viable soft tissue Depth of Debridement: subcutaneous tissue. Technique: Sharp excisional debridement to bleeding, viable wound base.  Dressing: Dry, sterile, compression dressing. Disposition: Patient tolerated procedure well. Patient to return in 2 week for follow-up.  No follow-ups on file.   No follow-ups on file.    Asberry Failing, DPM

## 2024-04-01 ENCOUNTER — Ambulatory Visit: Admitting: Podiatry

## 2024-04-16 ENCOUNTER — Ambulatory Visit: Admitting: Podiatry

## 2024-04-16 ENCOUNTER — Encounter: Payer: Self-pay | Admitting: Podiatry

## 2024-04-16 DIAGNOSIS — L97512 Non-pressure chronic ulcer of other part of right foot with fat layer exposed: Secondary | ICD-10-CM | POA: Diagnosis not present

## 2024-04-16 DIAGNOSIS — E08621 Diabetes mellitus due to underlying condition with foot ulcer: Secondary | ICD-10-CM | POA: Diagnosis not present

## 2024-04-16 NOTE — Progress Notes (Signed)
  Subjective:  Patient ID: Gary Mora, male    DOB: 1966-07-18,   MRN: [id number]  No chief complaint on file.   58 y.o. male presents for return of wound on right great toe . Relates doing well  and dressing as instructed.Patient is diabetic and last A1c was 6.8 . Denies any other pedal complaints. Denies n/v/f/c.   PCP: Wolm Scarlet MD    Past Medical History:  Diagnosis Date   Allergy    Chicken pox    Chronic kidney disease    kidney stone   Diabetes mellitus without complication (HCC)    Headache(784.0)    tension after using the computer   Hypertension    Sleep apnea    Had surgery to correct no cpapa used   Substance abuse (HCC)     Objective:  Physical Exam: Vascular: DP/PT pulses 2/4 bilateral. CFT <3 seconds. Normal hair growth on digits. No edema.  Skin. No lacerations or abrasions bilateral feet. Hyperkeratotic lesion noted medial plantar right hallux. Ulceration reopened. No erythema edema or purulence noted. Measurements below. . No erythema edema or purulence noted.  Musculoskeletal: MMT 5/5 bilateral lower extremities in DF, PF, Inversion and Eversion. Deceased ROM in DF of ankle joint.  Neurological: Sensation intact to light touch.      Assessment:   1. Diabetic ulcer of toe of right foot associated with diabetes mellitus due to underlying condition, with fat layer exposed (HCC)             Plan:  Patient was evaluated and treated and all questions answered. Ulcer plantar right hallux wound with fat layer exposed  -Debridement as below. -Dressed with betadine, DSD. -Off-loading with surgical shoe. Will continue to commit to the shoe as it is improving.  -No abx indicated.  -Discussed glucose control and proper protein-rich diet.  -Discussed if any worsening redness, pain, fever or chills to call or may need to report to the emergency room. Patient expressed understanding.   Procedure: Excisional Debridement of Wound Rationale: Removal  of non-viable soft tissue from the wound to promote healing.  Anesthesia: none Pre-Debridement Wound Measurements: Overlying calus  Post-Debridement Wound Measurements: 0.2 cm x 0.1 cm x 0.2 cm  Type of Debridement: Sharp Excisional Tissue Removed: Non-viable soft tissue Depth of Debridement: subcutaneous tissue. Technique: Sharp excisional debridement to bleeding, viable wound base.  Dressing: Dry, sterile, compression dressing. Disposition: Patient tolerated procedure well. Patient to return in 2 week for follow-up.  No follow-ups on file.   No follow-ups on file.    Asberry Failing, DPM

## 2024-05-07 ENCOUNTER — Ambulatory Visit: Admitting: Podiatry

## 2024-05-28 ENCOUNTER — Ambulatory Visit: Admitting: Podiatry

## 2024-05-28 DIAGNOSIS — L97512 Non-pressure chronic ulcer of other part of right foot with fat layer exposed: Secondary | ICD-10-CM | POA: Diagnosis not present

## 2024-05-28 DIAGNOSIS — E08621 Diabetes mellitus due to underlying condition with foot ulcer: Secondary | ICD-10-CM | POA: Diagnosis not present

## 2024-05-28 NOTE — Progress Notes (Signed)
 Subjective:  Patient ID: Gary Mora, male    DOB: 1965/09/26,   MRN: [id number]  Chief Complaint  Patient presents with   Wound Check    Diabetic ulcer R Great toe plantar. Open, callused.  Pt states pain not worse but it has gotten worse looking he was at the beach and not wearing his post op shoe. Diabetic A1c 6.5.  no anti coag    58 y.o. male presents for return of wound on right great toe . Relates doing okand dressing as instructed.Patient is diabetic and last A1c was 6.8 . Denies any other pedal complaints. Denies n/v/f/c.   PCP: Wolm Scarlet MD    Past Medical History:  Diagnosis Date   Allergy    Chicken pox    Chronic kidney disease    kidney stone   Diabetes mellitus without complication (HCC)    Headache(784.0)    tension after using the computer   Hypertension    Sleep apnea    Had surgery to correct no cpapa used   Substance abuse (HCC)     Objective:  Physical Exam: Vascular: DP/PT pulses 2/4 bilateral. CFT <3 seconds. Normal hair growth on digits. No edema.  Skin. No lacerations or abrasions bilateral feet. Hyperkeratotic lesion noted medial plantar right hallux. Ulceration reopened. No erythema edema or purulence noted. Measurements below. . No erythema edema or purulence noted.  Musculoskeletal: MMT 5/5 bilateral lower extremities in DF, PF, Inversion and Eversion. Deceased ROM in DF of ankle joint.  Neurological: Sensation intact to light touch.      Assessment:   No diagnosis found.           Plan:  Patient was evaluated and treated and all questions answered. Ulcer plantar right hallux wound with fat layer exposed  -Debridement as below. -Dressed with betadine, DSD. -Off-loading with surgical shoe. Will continue to commit to the shoe as it is improving.  -No abx indicated.  -Discussed glucose control and proper protein-rich diet.  -Discussed if any worsening redness, pain, fever or chills to call or may need to report to the  emergency room. Patient expressed understanding.    Procedure: Excisional Debridement of Wound Tool: Sharp #312 chisel blade/tissue nipper Type of Debridement: Sharp Excisional Frequency: Every two weeks until appropriately healed.  Dressing is to be changed daily/keeping the wound clean and dry Rationale: Removal of non-viable soft tissue from the wound to promote healing.  Anesthesia: none Pre-Debridement Wound Measurements: Overlying callus  Post-Debridement Wound Measurements: 0.2 cm x 0.2 cm x 0.3 cm  Area devitalized tissue removed(nonviable tissue only): 0.2 cm x 0.2 cm.  Blood loss: Minimal (<10cc) Depth of Debridement: with fat layer exposed Description of tissue removed: Devitalized Tissue, Non-viable tissue, and Other: callus  Technique: The wound and the surrounding skin were prepped and draped in usual aseptic fashion.  Aseptic technique was maintained throughout the procedure.  Using #312 blade/tissue nipper sharp debridement of necrotic/nonviable tissue was performed until healthy bleeding wound bed was achieved.  No underlying bone or tendon was exposed during debridement.  The wound was thoroughly irrigated with normal saline solution Wound Progress:  Current Wound Volume: Debridement was performed of the chronic nonhealing diabetic foot wound on plantar right hallux .  Debridement removed 0.2 cm x 0.2 cm of the necrotic tissue and subcutaneous tissue and  purulent drainage was not present. Presence/absence of tissue: Necrotic tissue/nonviable tissue present at the base of the wound.  Sharp debridement was performed to remove the necrotic tissue/nonviable  tissue back to viable tissue.  No devitalized/nonviable tissue present postdebridement.  Wound appeared clean and clear of infection No material in the wound was present that was identified to be inhibiting healing. Dressing: Dry, sterile, compression dressing. Disposition: Patient tolerated procedure well. Patient to return in  2 weeks for follow-up or as listed above.  No follow-ups on file.   No follow-ups on file.   No follow-ups on file.    Asberry Failing, DPM

## 2024-06-11 ENCOUNTER — Encounter: Payer: Self-pay | Admitting: Podiatry

## 2024-06-11 ENCOUNTER — Ambulatory Visit: Admitting: Podiatry

## 2024-06-11 VITALS — BP 124/89 | HR 100

## 2024-06-11 DIAGNOSIS — L97512 Non-pressure chronic ulcer of other part of right foot with fat layer exposed: Secondary | ICD-10-CM

## 2024-06-11 DIAGNOSIS — E08621 Diabetes mellitus due to underlying condition with foot ulcer: Secondary | ICD-10-CM | POA: Diagnosis not present

## 2024-06-11 NOTE — Progress Notes (Signed)
 Subjective:  Patient ID: Gary Mora, male    DOB: 1965/11/15,   MRN: [id number]  Chief Complaint  Patient presents with   Diabetic Ulcer    It's doing a lot better.    58 y.o. male presents for return of wound on right great toe . Relates doing okand dressing as instructed.Patient is diabetic and last A1c was 6.8 . Denies any other pedal complaints. Denies n/v/f/c.   PCP: Wolm Scarlet MD    Past Medical History:  Diagnosis Date   Allergy    Chicken pox    Chronic kidney disease    kidney stone   Diabetes mellitus without complication (HCC)    Headache(784.0)    tension after using the computer   Hypertension    Sleep apnea    Had surgery to correct no cpapa used   Substance abuse (HCC)     Objective:  Physical Exam: Vascular: DP/PT pulses 2/4 bilateral. CFT <3 seconds. Normal hair growth on digits. No edema.  Skin. No lacerations or abrasions bilateral feet. Hyperkeratotic lesion noted medial plantar right hallux. Ulceration reopened. No erythema edema or purulence noted. Measurements below. . No erythema edema or purulence noted.  Musculoskeletal: MMT 5/5 bilateral lower extremities in DF, PF, Inversion and Eversion. Deceased ROM in DF of ankle joint.  Neurological: Sensation intact to light touch.      Assessment:   1. Diabetic ulcer of toe of right foot associated with diabetes mellitus due to underlying condition, with fat layer exposed (HCC)              Plan:  Patient was evaluated and treated and all questions answered. Ulcer plantar right hallux wound with fat layer exposed  -Debridement as below. -Dressed with betadine, DSD. -Off-loading with surgical shoe. Will continue to commit to the shoe as it is improving.  -No abx indicated.  -Discussed glucose control and proper protein-rich diet.  -Discussed if any worsening redness, pain, fever or chills to call or may need to report to the emergency room. Patient expressed understanding.     Procedure: Excisional Debridement of Wound Tool: Sharp #312 chisel blade/tissue nipper Type of Debridement: Sharp Excisional Frequency: Every two weeks until appropriately healed.  Dressing is to be changed daily/keeping the wound clean and dry Rationale: Removal of non-viable soft tissue from the wound to promote healing.  Anesthesia: none Pre-Debridement Wound Measurements: Overlying callus  Post-Debridement Wound Measurements: 0.4 cm x 0.2 cm x 0.2 cm  Area devitalized tissue removed(nonviable tissue only): 0.4 cm x 0.2 cm.  Blood loss: Minimal (<10cc) Depth of Debridement: with fat layer exposed Description of tissue removed: Devitalized Tissue, Non-viable tissue, and Other: callus  Technique: The wound and the surrounding skin were prepped and draped in usual aseptic fashion.  Aseptic technique was maintained throughout the procedure.  Using #312 blade/tissue nipper sharp debridement of necrotic/nonviable tissue was performed until healthy bleeding wound bed was achieved.  No underlying bone or tendon was exposed during debridement.  The wound was thoroughly irrigated with normal saline solution Wound Progress:  Current Wound Volume: Debridement was performed of the chronic nonhealing diabetic foot wound on plantar right hallux .  Debridement removed 0.4 cm x 0.2 cm of the necrotic tissue and subcutaneous tissue and  purulent drainage was not present. Presence/absence of tissue: Necrotic tissue/nonviable tissue present at the base of the wound.  Sharp debridement was performed to remove the necrotic tissue/nonviable tissue back to viable tissue.  No devitalized/nonviable tissue present postdebridement.  Wound  appeared clean and clear of infection No material in the wound was present that was identified to be inhibiting healing. Dressing: Dry, sterile, compression dressing. Disposition: Patient tolerated procedure well. Patient to return in 2 weeks for follow-up or as listed  above.    Return in about 2 weeks (around 06/25/2024) for wound check.    Asberry Failing, DPM

## 2024-07-02 ENCOUNTER — Encounter: Payer: Self-pay | Admitting: Podiatry

## 2024-07-02 ENCOUNTER — Ambulatory Visit: Admitting: Podiatry

## 2024-07-02 DIAGNOSIS — E08621 Diabetes mellitus due to underlying condition with foot ulcer: Secondary | ICD-10-CM

## 2024-07-02 DIAGNOSIS — L97512 Non-pressure chronic ulcer of other part of right foot with fat layer exposed: Secondary | ICD-10-CM | POA: Diagnosis not present

## 2024-07-02 NOTE — Progress Notes (Signed)
 Subjective:  Patient ID: Gary Mora, male    DOB: [birth year],   MRN: [id number]  Chief Complaint  Patient presents with   Diabetic Ulcer    It's doing better.  Saw Dr. Micheal - 12/18/2023;  A1c 6.9       58 y.o. male presents for return of wound on right great toe . Relates doing okand dressing as instructed.Patient is diabetic and last A1c was 6.8 . Denies any other pedal complaints. Denies n/v/f/c.   PCP: Wolm Micheal MD    Past Medical History:  Diagnosis Date   Allergy    Chicken pox    Chronic kidney disease    kidney stone   Diabetes mellitus without complication (HCC)    Headache(784.0)    tension after using the computer   Hypertension    Sleep apnea    Had surgery to correct no cpapa used   Substance abuse (HCC)     Objective:  Physical Exam: Vascular: DP/PT pulses 2/4 bilateral. CFT <3 seconds. Normal hair growth on digits. No edema.  Skin. No lacerations or abrasions bilateral feet. Hyperkeratotic lesion noted medial plantar right hallux. Ulceration reopened. No erythema edema or purulence noted. Measurements below. . No erythema edema or purulence noted.  Musculoskeletal: MMT 5/5 bilateral lower extremities in DF, PF, Inversion and Eversion. Deceased ROM in DF of ankle joint.  Neurological: Sensation intact to light touch.      Assessment:   1. Diabetic ulcer of toe of right foot associated with diabetes mellitus due to underlying condition, with fat layer exposed (HCC)               Plan:  Patient was evaluated and treated and all questions answered. Ulcer plantar right hallux wound with fat layer exposed  -Debridement as below. -Dressed with betadine, DSD. -Off-loading with surgical shoe. Will continue to commit to the shoe as it is improving.  -No abx indicated.  -Discussed glucose control and proper protein-rich diet.  -Discussed if any worsening redness, pain, fever or chills to call or may need to report to the emergency  room. Patient expressed understanding.    Procedure: Excisional Debridement of Wound Tool: Sharp #312 chisel blade/tissue nipper Type of Debridement: Sharp Excisional Frequency: Every two weeks until appropriately healed.  Dressing is to be changed daily/keeping the wound clean and dry Rationale: Removal of non-viable soft tissue from the wound to promote healing.  Anesthesia: none Pre-Debridement Wound Measurements: Overlying callus  Post-Debridement Wound Measurements: 0.2 cm x 0.1 cm x 0.2 cm  Area devitalized tissue removed(nonviable tissue only): 0.2 cm x 0.1 cm.  Blood loss: Minimal (<10cc) Depth of Debridement: with fat layer exposed Description of tissue removed: Devitalized Tissue, Non-viable tissue, and Other: callus  Technique: The wound and the surrounding skin were prepped and draped in usual aseptic fashion.  Aseptic technique was maintained throughout the procedure.  Using #312 blade/tissue nipper sharp debridement of necrotic/nonviable tissue was performed until healthy bleeding wound bed was achieved.  No underlying bone or tendon was exposed during debridement.  The wound was thoroughly irrigated with normal saline solution Wound Progress:  Current Wound Volume: Debridement was performed of the chronic nonhealing diabetic foot wound on plantar right hallux .  Debridement removed 0.2 cm x 0.1cm of the necrotic tissue and subcutaneous tissue and  purulent drainage was not present. Presence/absence of tissue: Necrotic tissue/nonviable tissue present at the base of the wound.  Sharp debridement was performed to remove the necrotic tissue/nonviable tissue back to  viable tissue.  No devitalized/nonviable tissue present postdebridement.  Wound appeared clean and clear of infection No material in the wound was present that was identified to be inhibiting healing. Dressing: Dry, sterile, compression dressing. Disposition: Patient tolerated procedure well. Patient to return in 2 weeks  for follow-up or as listed above.    Return in about 3 weeks (around 07/23/2024) for wound check.    Gary Mora, DPM

## 2024-07-23 ENCOUNTER — Ambulatory Visit (INDEPENDENT_AMBULATORY_CARE_PROVIDER_SITE_OTHER): Admitting: Podiatry

## 2024-07-23 DIAGNOSIS — Z91199 Patient's noncompliance with other medical treatment and regimen due to unspecified reason: Secondary | ICD-10-CM

## 2024-07-23 NOTE — Progress Notes (Signed)
No show weather

## 2024-08-27 ENCOUNTER — Ambulatory Visit: Admitting: Podiatry

## 2024-08-27 ENCOUNTER — Encounter: Payer: Self-pay | Admitting: Podiatry

## 2024-08-27 VITALS — BP 155/102 | HR 104 | Temp 95.9°F

## 2024-08-27 DIAGNOSIS — E08621 Diabetes mellitus due to underlying condition with foot ulcer: Secondary | ICD-10-CM | POA: Diagnosis not present

## 2024-08-27 DIAGNOSIS — L97512 Non-pressure chronic ulcer of other part of right foot with fat layer exposed: Secondary | ICD-10-CM

## 2024-08-27 NOTE — Progress Notes (Signed)
 "  Subjective:  Patient ID: Gary Mora, male    DOB: Aug 10, 1966,   MRN: [id number]  Chief Complaint  Patient presents with   Diabetic Ulcer    It was doing good but not it's not.  I been on it a lot.  I did some hiking.  I also took a fall.  It started bleeding again.  Hopefully it's not that bad.  Saw Dr. Micheal - 12/18/2023;  A1c 6.9    59 y.o. male presents for return of wound on right great toe . He has not been seen since 11/14. Relates was ok but now not doing well and doing things he knows he shouldn't have done. He relates he has not been wearing his surgical shoe. Patient is diabetic and last A1c was 6.8 . Denies any other pedal complaints. Denies n/v/f/c.   PCP: Wolm Micheal MD    Past Medical History:  Diagnosis Date   Allergy    Chicken pox    Chronic kidney disease    kidney stone   Diabetes mellitus without complication (HCC)    Headache(784.0)    tension after using the computer   Hypertension    Sleep apnea    Had surgery to correct no cpapa used   Substance abuse (HCC)     Objective:  Physical Exam: Vascular: DP/PT pulses 2/4 bilateral. CFT <3 seconds. Normal hair growth on digits. No edema.  Skin. No lacerations or abrasions bilateral feet. Hyperkeratotic lesion noted medial plantar right hallux. Ulceration reopened. No erythema edema or purulence noted. Measurements below. . No erythema edema or purulence noted.  Musculoskeletal: MMT 5/5 bilateral lower extremities in DF, PF, Inversion and Eversion. Deceased ROM in DF of ankle joint.  Neurological: Sensation intact to light touch.      Assessment:   1. Diabetic ulcer of toe of right foot associated with diabetes mellitus due to underlying condition, with fat layer exposed (HCC)                Plan:  Patient was evaluated and treated and all questions answered. Ulcer plantar right hallux wound with fat layer exposed  -Debridement as below. -Dressed with betadine, DSD. -Off-loading  with surgical shoe. Discussed continuing with surgical shoe to get this improved and then he can go back to normal activities.  -No abx indicated.  -Discussed glucose control and proper protein-rich diet.  -Discussed if any worsening redness, pain, fever or chills to call or may need to report to the emergency room. Patient expressed understanding.    Procedure: Excisional Debridement of Wound Tool: Sharp #312 chisel blade/tissue nipper Type of Debridement: Sharp Excisional Frequency: Every two weeks until appropriately healed.  Dressing is to be changed daily/keeping the wound clean and dry Rationale: Removal of non-viable soft tissue from the wound to promote healing.  Anesthesia: none Pre-Debridement Wound Measurements: Overlying callus  Post-Debridement Wound Measurements: 0.4 cm x 0.2 cm x 0.2 cm  Area devitalized tissue removed(nonviable tissue only): 0.5 cm x 1 cm.  Blood loss: Minimal (<10cc) Depth of Debridement: with fat layer exposed Description of tissue removed: Devitalized Tissue, Non-viable tissue, and Other: callus  Technique: The wound and the surrounding skin were prepped and draped in usual aseptic fashion.  Aseptic technique was maintained throughout the procedure.  Using #312 blade/tissue nipper sharp debridement of necrotic/nonviable tissue was performed until healthy bleeding wound bed was achieved.  No underlying bone or tendon was exposed during debridement.  The wound was thoroughly irrigated with normal  saline solution Wound Progress:  Current Wound Volume: Debridement was performed of the chronic nonhealing diabetic foot wound on plantar right hallux .  Debridement removed 0.5 cm x 1cm of the necrotic tissue and subcutaneous tissue and  purulent drainage was not present. Presence/absence of tissue: Necrotic tissue/nonviable tissue present at the base of the wound.  Sharp debridement was performed to remove the necrotic tissue/nonviable tissue back to viable tissue.   No devitalized/nonviable tissue present postdebridement.  Wound appeared clean and clear of infection No material in the wound was present that was identified to be inhibiting healing. Dressing: Dry, sterile, compression dressing. Disposition: Patient tolerated procedure well. Patient to return in 2 weeks for follow-up or as listed above.    No follow-ups on file.    Asberry Failing, DPM    "

## 2024-09-13 ENCOUNTER — Ambulatory Visit: Admitting: Podiatry

## 2024-09-20 ENCOUNTER — Ambulatory Visit: Admitting: Podiatry

## 2024-10-01 ENCOUNTER — Ambulatory Visit: Admitting: Podiatry
